# Patient Record
Sex: Female | Born: 1975 | Race: White | Hispanic: No | Marital: Married | Smoking: Former smoker
Health system: Southern US, Community
[De-identification: ages and names within clinical notes are randomized; demographics above are authoritative.]

## PROBLEM LIST (undated history)

## (undated) DIAGNOSIS — F329 Major depressive disorder, single episode, unspecified: Secondary | ICD-10-CM

## (undated) DIAGNOSIS — I10 Essential (primary) hypertension: Secondary | ICD-10-CM

## (undated) DIAGNOSIS — F32A Depression, unspecified: Secondary | ICD-10-CM

## (undated) HISTORY — DX: Essential (primary) hypertension: I10

## (undated) HISTORY — DX: Depression, unspecified: F32.A

## (undated) HISTORY — DX: Major depressive disorder, single episode, unspecified: F32.9

## (undated) HISTORY — PX: OTHER SURGICAL HISTORY: SHX169

---

## 1993-05-14 HISTORY — PX: LIPOSUCTION: SHX10

## 1998-10-05 ENCOUNTER — Other Ambulatory Visit: Admission: RE | Admit: 1998-10-05 | Discharge: 1998-10-05 | Payer: Self-pay | Admitting: Obstetrics and Gynecology

## 1999-11-10 ENCOUNTER — Other Ambulatory Visit: Admission: RE | Admit: 1999-11-10 | Discharge: 1999-11-10 | Payer: Self-pay | Admitting: Obstetrics and Gynecology

## 2000-01-05 ENCOUNTER — Other Ambulatory Visit: Admission: RE | Admit: 2000-01-05 | Discharge: 2000-01-05 | Payer: Self-pay | Admitting: Obstetrics and Gynecology

## 2000-01-05 ENCOUNTER — Encounter (INDEPENDENT_AMBULATORY_CARE_PROVIDER_SITE_OTHER): Payer: Self-pay

## 2000-05-08 ENCOUNTER — Other Ambulatory Visit: Admission: RE | Admit: 2000-05-08 | Discharge: 2000-05-08 | Payer: Self-pay | Admitting: Obstetrics and Gynecology

## 2000-09-23 ENCOUNTER — Other Ambulatory Visit: Admission: RE | Admit: 2000-09-23 | Discharge: 2000-09-23 | Payer: Self-pay | Admitting: Obstetrics and Gynecology

## 2000-12-20 ENCOUNTER — Other Ambulatory Visit: Admission: RE | Admit: 2000-12-20 | Discharge: 2000-12-20 | Payer: Self-pay | Admitting: Obstetrics and Gynecology

## 2001-08-01 ENCOUNTER — Other Ambulatory Visit: Admission: RE | Admit: 2001-08-01 | Discharge: 2001-08-01 | Payer: Self-pay | Admitting: Obstetrics and Gynecology

## 2001-12-23 ENCOUNTER — Other Ambulatory Visit: Admission: RE | Admit: 2001-12-23 | Discharge: 2001-12-23 | Payer: Self-pay | Admitting: Obstetrics and Gynecology

## 2002-12-25 ENCOUNTER — Other Ambulatory Visit: Admission: RE | Admit: 2002-12-25 | Discharge: 2002-12-25 | Payer: Self-pay | Admitting: Gynecology

## 2003-10-17 ENCOUNTER — Emergency Department (HOSPITAL_COMMUNITY): Admission: EM | Admit: 2003-10-17 | Discharge: 2003-10-17 | Payer: Self-pay | Admitting: Emergency Medicine

## 2003-10-18 ENCOUNTER — Encounter: Admission: RE | Admit: 2003-10-18 | Discharge: 2003-10-18 | Payer: Self-pay | Admitting: Family Medicine

## 2003-10-21 ENCOUNTER — Encounter: Admission: RE | Admit: 2003-10-21 | Discharge: 2003-10-21 | Payer: Self-pay | Admitting: Family Medicine

## 2003-12-27 ENCOUNTER — Other Ambulatory Visit: Admission: RE | Admit: 2003-12-27 | Discharge: 2003-12-27 | Payer: Self-pay | Admitting: Gynecology

## 2004-06-08 ENCOUNTER — Ambulatory Visit: Payer: Self-pay | Admitting: Family Medicine

## 2004-11-09 ENCOUNTER — Encounter: Admission: RE | Admit: 2004-11-09 | Discharge: 2004-11-09 | Payer: Self-pay | Admitting: Nephrology

## 2004-12-28 ENCOUNTER — Other Ambulatory Visit: Admission: RE | Admit: 2004-12-28 | Discharge: 2004-12-28 | Payer: Self-pay | Admitting: Obstetrics and Gynecology

## 2005-01-24 ENCOUNTER — Encounter: Admission: RE | Admit: 2005-01-24 | Discharge: 2005-01-24 | Payer: Self-pay | Admitting: Orthopedic Surgery

## 2007-05-22 ENCOUNTER — Encounter: Admission: RE | Admit: 2007-05-22 | Discharge: 2007-05-22 | Payer: Self-pay | Admitting: Internal Medicine

## 2008-05-05 ENCOUNTER — Encounter: Admission: RE | Admit: 2008-05-05 | Discharge: 2008-05-05 | Payer: Self-pay | Admitting: Obstetrics and Gynecology

## 2008-09-16 ENCOUNTER — Encounter: Admission: RE | Admit: 2008-09-16 | Discharge: 2008-09-16 | Payer: Self-pay | Admitting: Orthopedic Surgery

## 2009-01-04 ENCOUNTER — Ambulatory Visit (HOSPITAL_COMMUNITY): Admission: RE | Admit: 2009-01-04 | Discharge: 2009-01-04 | Payer: Self-pay | Admitting: Obstetrics and Gynecology

## 2009-07-10 ENCOUNTER — Inpatient Hospital Stay (HOSPITAL_COMMUNITY): Admission: AD | Admit: 2009-07-10 | Discharge: 2009-07-10 | Payer: Self-pay | Admitting: Obstetrics and Gynecology

## 2009-08-12 ENCOUNTER — Inpatient Hospital Stay (HOSPITAL_COMMUNITY): Admission: AD | Admit: 2009-08-12 | Discharge: 2009-08-21 | Payer: Self-pay | Admitting: Obstetrics & Gynecology

## 2009-08-15 ENCOUNTER — Encounter (INDEPENDENT_AMBULATORY_CARE_PROVIDER_SITE_OTHER): Payer: Self-pay | Admitting: Obstetrics & Gynecology

## 2009-09-08 ENCOUNTER — Encounter: Admission: RE | Admit: 2009-09-08 | Discharge: 2009-09-15 | Payer: Self-pay | Admitting: Obstetrics and Gynecology

## 2009-09-22 ENCOUNTER — Ambulatory Visit
Admission: RE | Admit: 2009-09-22 | Discharge: 2009-09-22 | Payer: Self-pay | Source: Home / Self Care | Admitting: Obstetrics and Gynecology

## 2010-08-02 LAB — COMPREHENSIVE METABOLIC PANEL
ALT: 109 U/L — ABNORMAL HIGH (ref 0–35)
ALT: 133 U/L — ABNORMAL HIGH (ref 0–35)
ALT: 28 U/L (ref 0–35)
ALT: 29 U/L (ref 0–35)
ALT: 35 U/L (ref 0–35)
ALT: 66 U/L — ABNORMAL HIGH (ref 0–35)
ALT: 66 U/L — ABNORMAL HIGH (ref 0–35)
ALT: 84 U/L — ABNORMAL HIGH (ref 0–35)
AST: 125 U/L — ABNORMAL HIGH (ref 0–37)
AST: 34 U/L (ref 0–37)
AST: 44 U/L — ABNORMAL HIGH (ref 0–37)
AST: 50 U/L — ABNORMAL HIGH (ref 0–37)
Albumin: 2.2 g/dL — ABNORMAL LOW (ref 3.5–5.2)
Albumin: 2.7 g/dL — ABNORMAL LOW (ref 3.5–5.2)
Albumin: 2.7 g/dL — ABNORMAL LOW (ref 3.5–5.2)
Albumin: 2.8 g/dL — ABNORMAL LOW (ref 3.5–5.2)
Albumin: 3 g/dL — ABNORMAL LOW (ref 3.5–5.2)
Albumin: 2.8 g/dL — ABNORMAL LOW (ref 3.5–5.2)
Alkaline Phosphatase: 61 U/L (ref 39–117)
Alkaline Phosphatase: 65 U/L (ref 39–117)
Alkaline Phosphatase: 72 U/L (ref 39–117)
Alkaline Phosphatase: 72 U/L (ref 39–117)
Alkaline Phosphatase: 73 U/L (ref 39–117)
Alkaline Phosphatase: 73 U/L (ref 39–117)
BUN: 13 mg/dL (ref 6–23)
BUN: 14 mg/dL (ref 6–23)
BUN: 3 mg/dL — ABNORMAL LOW (ref 6–23)
BUN: 7 mg/dL (ref 6–23)
BUN: 7 mg/dL (ref 6–23)
BUN: 8 mg/dL (ref 6–23)
BUN: 7 mg/dL (ref 6–23)
CO2: 22 mEq/L (ref 19–32)
CO2: 23 mEq/L (ref 19–32)
CO2: 25 mEq/L (ref 19–32)
CO2: 26 mEq/L (ref 19–32)
CO2: 27 mEq/L (ref 19–32)
CO2: 28 mEq/L (ref 19–32)
CO2: 28 mEq/L (ref 19–32)
CO2: 29 mEq/L (ref 19–32)
CO2: 31 mEq/L (ref 19–32)
Calcium: 7.4 mg/dL — ABNORMAL LOW (ref 8.4–10.5)
Calcium: 7.6 mg/dL — ABNORMAL LOW (ref 8.4–10.5)
Calcium: 7.8 mg/dL — ABNORMAL LOW (ref 8.4–10.5)
Calcium: 8 mg/dL — ABNORMAL LOW (ref 8.4–10.5)
Calcium: 8.1 mg/dL — ABNORMAL LOW (ref 8.4–10.5)
Calcium: 8.3 mg/dL — ABNORMAL LOW (ref 8.4–10.5)
Calcium: 8.6 mg/dL (ref 8.4–10.5)
Calcium: 8.8 mg/dL (ref 8.4–10.5)
Chloride: 100 mEq/L (ref 96–112)
Chloride: 102 mEq/L (ref 96–112)
Chloride: 102 mEq/L (ref 96–112)
Chloride: 102 mEq/L (ref 96–112)
Chloride: 103 mEq/L (ref 96–112)
Chloride: 104 mEq/L (ref 96–112)
Creatinine, Ser: 0.67 mg/dL (ref 0.4–1.2)
Creatinine, Ser: 0.67 mg/dL (ref 0.4–1.2)
Creatinine, Ser: 0.72 mg/dL (ref 0.4–1.2)
Creatinine, Ser: 0.73 mg/dL (ref 0.4–1.2)
Creatinine, Ser: 0.78 mg/dL (ref 0.4–1.2)
Creatinine, Ser: 0.83 mg/dL (ref 0.4–1.2)
Creatinine, Ser: 0.55 mg/dL (ref 0.4–1.2)
GFR calc Af Amer: >60 mL/min (ref >60)
GFR calc Af Amer: >60 mL/min (ref >60)
GFR calc Af Amer: >60 mL/min (ref >60)
GFR calc Af Amer: >60 mL/min (ref >60)
GFR calc Af Amer: >60 mL/min (ref >60)
GFR calc Af Amer: >60 mL/min (ref >60)
GFR calc Af Amer: >60 mL/min (ref >60)
GFR calc non Af Amer: >60 mL/min (ref >60)
GFR calc non Af Amer: >60 mL/min (ref >60)
GFR calc non Af Amer: >60 mL/min (ref >60)
GFR calc non Af Amer: >60 mL/min (ref >60)
GFR calc non Af Amer: >60 mL/min (ref >60)
GFR calc non Af Amer: >60 mL/min (ref >60)
GFR calc non Af Amer: >60 mL/min (ref >60)
GFR calc non Af Amer: >60 mL/min (ref >60)
GFR calc non Af Amer: >60 mL/min (ref >60)
GFR calc non Af Amer: >60 mL/min (ref >60)
GFR calc non Af Amer: >60 mL/min (ref >60)
Glucose, Bld: 103 mg/dL — ABNORMAL HIGH (ref 70–99)
Glucose, Bld: 109 mg/dL — ABNORMAL HIGH (ref 70–99)
Glucose, Bld: 158 mg/dL — ABNORMAL HIGH (ref 70–99)
Glucose, Bld: 80 mg/dL (ref 70–99)
Glucose, Bld: 81 mg/dL (ref 70–99)
Glucose, Bld: 87 mg/dL (ref 70–99)
Glucose, Bld: 88 mg/dL (ref 70–99)
Glucose, Bld: 90 mg/dL (ref 70–99)
Glucose, Bld: 99 mg/dL (ref 70–99)
Potassium: 3.8 mEq/L (ref 3.5–5.1)
Potassium: 3.9 mEq/L (ref 3.5–5.1)
Potassium: 4 mEq/L (ref 3.5–5.1)
Potassium: 4.1 mEq/L (ref 3.5–5.1)
Potassium: 4.1 mEq/L (ref 3.5–5.1)
Potassium: 4.5 mEq/L (ref 3.5–5.1)
Sodium: 130 mEq/L — ABNORMAL LOW (ref 135–145)
Sodium: 131 mEq/L — ABNORMAL LOW (ref 135–145)
Sodium: 132 mEq/L — ABNORMAL LOW (ref 135–145)
Sodium: 133 mEq/L — ABNORMAL LOW (ref 135–145)
Sodium: 135 mEq/L (ref 135–145)
Sodium: 135 mEq/L (ref 135–145)
Sodium: 135 mEq/L (ref 135–145)
Sodium: 137 mEq/L (ref 135–145)
Sodium: 137 mEq/L (ref 135–145)
Sodium: 137 mEq/L (ref 135–145)
Sodium: 137 mEq/L (ref 135–145)
Total Bilirubin: 0.3 mg/dL (ref 0.3–1.2)
Total Bilirubin: 0.4 mg/dL (ref 0.3–1.2)
Total Bilirubin: 0.4 mg/dL (ref 0.3–1.2)
Total Bilirubin: 0.5 mg/dL (ref 0.3–1.2)
Total Bilirubin: 0.2 mg/dL — ABNORMAL LOW (ref 0.3–1.2)
Total Protein: 5 g/dL — ABNORMAL LOW (ref 6.0–8.3)
Total Protein: 5.4 g/dL — ABNORMAL LOW (ref 6.0–8.3)
Total Protein: 5.5 g/dL — ABNORMAL LOW (ref 6.0–8.3)
Total Protein: 5.7 g/dL — ABNORMAL LOW (ref 6.0–8.3)
Total Protein: 5.8 g/dL — ABNORMAL LOW (ref 6.0–8.3)
Total Protein: 5.9 g/dL — ABNORMAL LOW (ref 6.0–8.3)

## 2010-08-02 LAB — CBC
HCT: 33.2 % — ABNORMAL LOW (ref 36.0–46.0)
HCT: 33.2 % — ABNORMAL LOW (ref 36.0–46.0)
HCT: 34.2 % — ABNORMAL LOW (ref 36.0–46.0)
HCT: 37.5 % (ref 36.0–46.0)
HCT: 39.1 % (ref 36.0–46.0)
HCT: 36 % (ref 36.0–46.0)
Hemoglobin: 11.3 g/dL — ABNORMAL LOW (ref 12.0–15.0)
Hemoglobin: 11.4 g/dL — ABNORMAL LOW (ref 12.0–15.0)
Hemoglobin: 11.6 g/dL — ABNORMAL LOW (ref 12.0–15.0)
Hemoglobin: 11.6 g/dL — ABNORMAL LOW (ref 12.0–15.0)
Hemoglobin: 12.2 g/dL (ref 12.0–15.0)
Hemoglobin: 12.2 g/dL (ref 12.0–15.0)
Hemoglobin: 12.2 g/dL (ref 12.0–15.0)
Hemoglobin: 12.6 g/dL (ref 12.0–15.0)
Hemoglobin: 12.7 g/dL (ref 12.0–15.0)
MCHC: 33.8 g/dL (ref 30.0–36.0)
MCHC: 34 g/dL (ref 30.0–36.0)
MCHC: 34.1 g/dL (ref 30.0–36.0)
MCHC: 34.2 g/dL (ref 30.0–36.0)
MCHC: 34.4 g/dL (ref 30.0–36.0)
MCHC: 34.5 g/dL (ref 30.0–36.0)
MCHC: 34.6 g/dL (ref 30.0–36.0)
MCHC: 34.7 g/dL (ref 30.0–36.0)
MCV: 92.7 fL (ref 78.0–100.0)
MCV: 93 fL (ref 78.0–100.0)
MCV: 93 fL (ref 78.0–100.0)
MCV: 93.2 fL (ref 78.0–100.0)
MCV: 93.2 fL (ref 78.0–100.0)
MCV: 93.3 fL (ref 78.0–100.0)
MCV: 93.3 fL (ref 78.0–100.0)
MCV: 93.6 fL (ref 78.0–100.0)
MCV: 93.6 fL (ref 78.0–100.0)
MCV: 92.3 fL (ref 78.0–100.0)
Platelets: 110 10*3/uL — ABNORMAL LOW (ref 150–400)
Platelets: 117 10*3/uL — ABNORMAL LOW (ref 150–400)
Platelets: 129 10*3/uL — ABNORMAL LOW (ref 150–400)
Platelets: 202 10*3/uL (ref 150–400)
Platelets: 210 10*3/uL (ref 150–400)
Platelets: 68 10*3/uL — ABNORMAL LOW (ref 150–400)
Platelets: 93 10*3/uL — ABNORMAL LOW (ref 150–400)
Platelets: 166 10*3/uL (ref 150–400)
RBC: 3.36 MIL/uL — ABNORMAL LOW (ref 3.87–5.11)
RBC: 3.47 MIL/uL — ABNORMAL LOW (ref 3.87–5.11)
RBC: 3.54 MIL/uL — ABNORMAL LOW (ref 3.87–5.11)
RBC: 3.58 MIL/uL — ABNORMAL LOW (ref 3.87–5.11)
RBC: 3.67 MIL/uL — ABNORMAL LOW (ref 3.87–5.11)
RBC: 3.82 MIL/uL — ABNORMAL LOW (ref 3.87–5.11)
RBC: 3.84 MIL/uL — ABNORMAL LOW (ref 3.87–5.11)
RBC: 4.03 MIL/uL (ref 3.87–5.11)
RBC: 4.1 MIL/uL (ref 3.87–5.11)
RDW: 12.9 % (ref 11.5–15.5)
RDW: 13.1 % (ref 11.5–15.5)
RDW: 13.3 % (ref 11.5–15.5)
RDW: 13.4 % (ref 11.5–15.5)
RDW: 13.4 % (ref 11.5–15.5)
RDW: 13.5 % (ref 11.5–15.5)
RDW: 12.3 % (ref 11.5–15.5)
WBC: 11.6 10*3/uL — ABNORMAL HIGH (ref 4.0–10.5)
WBC: 12.3 10*3/uL — ABNORMAL HIGH (ref 4.0–10.5)
WBC: 14.2 10*3/uL — ABNORMAL HIGH (ref 4.0–10.5)
WBC: 9.8 10*3/uL (ref 4.0–10.5)
WBC: 9.9 10*3/uL (ref 4.0–10.5)

## 2010-08-02 LAB — PLATELET COUNT: Platelets: 72 10*3/uL — ABNORMAL LOW (ref 150–400)

## 2010-08-02 LAB — URINALYSIS, ROUTINE W REFLEX MICROSCOPIC
Bilirubin Urine: NEGATIVE
Glucose, UA: NEGATIVE mg/dL
Nitrite: NEGATIVE
Protein, ur: NEGATIVE mg/dL
Urobilinogen, UA: 0.2 mg/dL (ref 0.0–1.0)
pH: 7 (ref 5.0–8.0)

## 2010-08-02 LAB — LACTATE DEHYDROGENASE
LDH: 135 U/L (ref 94–250)
LDH: 150 U/L (ref 94–250)
LDH: 163 U/L (ref 94–250)
LDH: 206 U/L (ref 94–250)
LDH: 217 U/L (ref 94–250)
LDH: 238 U/L (ref 94–250)
LDH: 298 U/L — ABNORMAL HIGH (ref 94–250)
LDH: 394 U/L — ABNORMAL HIGH (ref 94–250)

## 2010-08-02 LAB — STREP B DNA PROBE: Strep Group B Ag: NEGATIVE

## 2010-08-02 LAB — CREATININE CLEARANCE, URINE, 24 HOUR
Collection Interval-CRCL: 24 hours
Creatinine Clearance: 127 mL/min — ABNORMAL HIGH (ref 75–115)
Creatinine, Urine: 35.4 mg/dL
Creatinine: 0.73 mg/dL (ref 0.4–1.2)
Urine Total Volume-CRCL: 3775 mL

## 2010-08-02 LAB — AMYLASE: Amylase: 35 U/L (ref 0–105)

## 2010-08-02 LAB — MAGNESIUM
Magnesium: 4.7 mg/dL — ABNORMAL HIGH (ref 1.5–2.5)
Magnesium: 5 mg/dL — ABNORMAL HIGH (ref 1.5–2.5)
Magnesium: 5.2 mg/dL — ABNORMAL HIGH (ref 1.5–2.5)
Magnesium: 5.5 mg/dL — ABNORMAL HIGH (ref 1.5–2.5)
Magnesium: 6.4 mg/dL (ref 1.5–2.5)

## 2010-08-02 LAB — URIC ACID
Uric Acid, Serum: 5.8 mg/dL (ref 2.4–7.0)
Uric Acid, Serum: 6 mg/dL (ref 2.4–7.0)
Uric Acid, Serum: 6.3 mg/dL (ref 2.4–7.0)
Uric Acid, Serum: 6.4 mg/dL (ref 2.4–7.0)

## 2010-08-02 LAB — PROTEIN, URINE, 24 HOUR: Protein, 24H Urine: 227 mg/d — ABNORMAL HIGH (ref 50–100)

## 2011-02-05 ENCOUNTER — Encounter (INDEPENDENT_AMBULATORY_CARE_PROVIDER_SITE_OTHER): Payer: Self-pay

## 2011-02-20 ENCOUNTER — Ambulatory Visit (INDEPENDENT_AMBULATORY_CARE_PROVIDER_SITE_OTHER): Payer: BC Managed Care – PPO | Admitting: General Surgery

## 2011-02-20 ENCOUNTER — Encounter (INDEPENDENT_AMBULATORY_CARE_PROVIDER_SITE_OTHER): Payer: Self-pay | Admitting: General Surgery

## 2011-02-20 VITALS — BP 134/90 | HR 60 | Temp 98.5°F | Resp 12 | Ht 65.5 in | Wt 163.8 lb

## 2011-02-20 DIAGNOSIS — R1031 Right lower quadrant pain: Secondary | ICD-10-CM

## 2011-02-20 DIAGNOSIS — IMO0001 Reserved for inherently not codable concepts without codable children: Secondary | ICD-10-CM

## 2011-02-20 NOTE — Patient Instructions (Signed)
Come back to see me if pain recurs with a  Bulge.

## 2011-02-20 NOTE — Progress Notes (Signed)
HPI The patient comes in today complaining of pain and discomfort in the right inguinal and groin area. She has never noticed a bulge in that area. The pain is coming she has lifted her young son and has no nausea or vomiting.  PE On examination I do not notice a hernia on either side. I examined the patient in the standing and in the supine position and no hernia was noted on either side. The discomfort was noted right at the pubic attachment of the inguinal ligament however there was no hernia. I did not palpate a femoral hernia either  Studiy review No studies review this time.  Assessment Right inguinal pain of unknown etiology possible A. strain of the muscle.  Plan There is no plan for surgery at this time. If further workup is necessary her primary care physician or her gynecologist should be contacted

## 2012-08-14 ENCOUNTER — Other Ambulatory Visit: Payer: Self-pay | Admitting: Dermatology

## 2014-09-08 ENCOUNTER — Other Ambulatory Visit: Payer: Self-pay | Admitting: Dermatology

## 2015-10-31 DIAGNOSIS — M5442 Lumbago with sciatica, left side: Secondary | ICD-10-CM | POA: Diagnosis not present

## 2015-11-02 DIAGNOSIS — D225 Melanocytic nevi of trunk: Secondary | ICD-10-CM | POA: Diagnosis not present

## 2015-11-02 DIAGNOSIS — L821 Other seborrheic keratosis: Secondary | ICD-10-CM | POA: Diagnosis not present

## 2015-11-02 DIAGNOSIS — D2261 Melanocytic nevi of right upper limb, including shoulder: Secondary | ICD-10-CM | POA: Diagnosis not present

## 2015-11-09 DIAGNOSIS — M5126 Other intervertebral disc displacement, lumbar region: Secondary | ICD-10-CM | POA: Diagnosis not present

## 2015-11-14 DIAGNOSIS — M5442 Lumbago with sciatica, left side: Secondary | ICD-10-CM | POA: Diagnosis not present

## 2015-12-14 DIAGNOSIS — R42 Dizziness and giddiness: Secondary | ICD-10-CM | POA: Diagnosis not present

## 2015-12-14 DIAGNOSIS — I10 Essential (primary) hypertension: Secondary | ICD-10-CM | POA: Diagnosis not present

## 2015-12-14 DIAGNOSIS — Z6830 Body mass index (BMI) 30.0-30.9, adult: Secondary | ICD-10-CM | POA: Diagnosis not present

## 2015-12-14 DIAGNOSIS — H8110 Benign paroxysmal vertigo, unspecified ear: Secondary | ICD-10-CM | POA: Diagnosis not present

## 2015-12-20 DIAGNOSIS — F4321 Adjustment disorder with depressed mood: Secondary | ICD-10-CM | POA: Diagnosis not present

## 2015-12-27 DIAGNOSIS — M5416 Radiculopathy, lumbar region: Secondary | ICD-10-CM | POA: Diagnosis not present

## 2016-01-05 DIAGNOSIS — F4321 Adjustment disorder with depressed mood: Secondary | ICD-10-CM | POA: Diagnosis not present

## 2016-01-23 DIAGNOSIS — F4321 Adjustment disorder with depressed mood: Secondary | ICD-10-CM | POA: Diagnosis not present

## 2016-02-08 DIAGNOSIS — M5416 Radiculopathy, lumbar region: Secondary | ICD-10-CM | POA: Diagnosis not present

## 2016-02-28 DIAGNOSIS — M5442 Lumbago with sciatica, left side: Secondary | ICD-10-CM | POA: Diagnosis not present

## 2016-05-08 DIAGNOSIS — Z113 Encounter for screening for infections with a predominantly sexual mode of transmission: Secondary | ICD-10-CM | POA: Diagnosis not present

## 2016-05-08 DIAGNOSIS — R87610 Atypical squamous cells of undetermined significance on cytologic smear of cervix (ASC-US): Secondary | ICD-10-CM | POA: Diagnosis not present

## 2016-05-08 DIAGNOSIS — Z1231 Encounter for screening mammogram for malignant neoplasm of breast: Secondary | ICD-10-CM | POA: Diagnosis not present

## 2016-05-08 DIAGNOSIS — Z1151 Encounter for screening for human papillomavirus (HPV): Secondary | ICD-10-CM | POA: Diagnosis not present

## 2016-05-08 DIAGNOSIS — Z01419 Encounter for gynecological examination (general) (routine) without abnormal findings: Secondary | ICD-10-CM | POA: Diagnosis not present

## 2016-05-08 DIAGNOSIS — Z6827 Body mass index (BMI) 27.0-27.9, adult: Secondary | ICD-10-CM | POA: Diagnosis not present

## 2016-07-24 DIAGNOSIS — J029 Acute pharyngitis, unspecified: Secondary | ICD-10-CM | POA: Diagnosis not present

## 2016-07-24 DIAGNOSIS — R05 Cough: Secondary | ICD-10-CM | POA: Diagnosis not present

## 2016-07-24 DIAGNOSIS — Z6829 Body mass index (BMI) 29.0-29.9, adult: Secondary | ICD-10-CM | POA: Diagnosis not present

## 2017-01-11 DIAGNOSIS — J111 Influenza due to unidentified influenza virus with other respiratory manifestations: Secondary | ICD-10-CM | POA: Diagnosis not present

## 2017-01-21 DIAGNOSIS — D1801 Hemangioma of skin and subcutaneous tissue: Secondary | ICD-10-CM | POA: Diagnosis not present

## 2017-01-21 DIAGNOSIS — D225 Melanocytic nevi of trunk: Secondary | ICD-10-CM | POA: Diagnosis not present

## 2017-01-21 DIAGNOSIS — L812 Freckles: Secondary | ICD-10-CM | POA: Diagnosis not present

## 2017-01-21 DIAGNOSIS — D2261 Melanocytic nevi of right upper limb, including shoulder: Secondary | ICD-10-CM | POA: Diagnosis not present

## 2017-03-05 DIAGNOSIS — I10 Essential (primary) hypertension: Secondary | ICD-10-CM | POA: Diagnosis not present

## 2017-03-05 DIAGNOSIS — Z6828 Body mass index (BMI) 28.0-28.9, adult: Secondary | ICD-10-CM | POA: Diagnosis not present

## 2017-03-05 DIAGNOSIS — M62838 Other muscle spasm: Secondary | ICD-10-CM | POA: Diagnosis not present

## 2017-03-05 DIAGNOSIS — R0789 Other chest pain: Secondary | ICD-10-CM | POA: Diagnosis not present

## 2017-09-30 ENCOUNTER — Other Ambulatory Visit: Payer: Self-pay

## 2017-09-30 ENCOUNTER — Ambulatory Visit (INDEPENDENT_AMBULATORY_CARE_PROVIDER_SITE_OTHER): Payer: BLUE CROSS/BLUE SHIELD

## 2017-09-30 ENCOUNTER — Ambulatory Visit: Payer: BLUE CROSS/BLUE SHIELD | Admitting: Podiatry

## 2017-09-30 ENCOUNTER — Encounter: Payer: Self-pay | Admitting: Podiatry

## 2017-09-30 VITALS — BP 152/92 | HR 58

## 2017-09-30 DIAGNOSIS — M2010 Hallux valgus (acquired), unspecified foot: Secondary | ICD-10-CM

## 2017-09-30 DIAGNOSIS — M2011 Hallux valgus (acquired), right foot: Secondary | ICD-10-CM

## 2017-09-30 DIAGNOSIS — M779 Enthesopathy, unspecified: Secondary | ICD-10-CM

## 2017-09-30 DIAGNOSIS — M2012 Hallux valgus (acquired), left foot: Secondary | ICD-10-CM

## 2017-09-30 MED ORDER — TRIAMCINOLONE ACETONIDE 10 MG/ML IJ SUSP
10.0000 mg | Freq: Once | INTRAMUSCULAR | Status: AC
  Administered 2017-09-30: 10 mg

## 2017-09-30 MED ORDER — DICLOFENAC SODIUM 75 MG PO TBEC: 75.0000 mg | DELAYED_RELEASE_TABLET | Freq: Two times a day (BID) | ORAL | 2 refills | Status: DC

## 2017-09-30 NOTE — Progress Notes (Signed)
Subjective:   Patient ID: Phyllis Harvey, female   DOB: 42 y.o.   MRN: [id number]   HPI Patient presents stating that she is been developing progressive discomfort with the big toe joint of the right foot that is been going on for fairly long time and over the last few months the whole foot has been bothering her more.  Her mild deformity on the left but not to the same degree and states it does get red and irritated and can be irritated at nighttime also.  Patient does not smoke likes to be active   Review of Systems  All other systems reviewed and are negative.       Objective:  Physical Exam  Constitutional: She appears well-developed and well-nourished.  Cardiovascular: Intact distal pulses.  Pulmonary/Chest: Effort normal.  Musculoskeletal: Normal range of motion.  Neurological: She is alert.  Skin: Skin is warm.  Nursing note and vitals reviewed.   Neurovascular status found to be intact muscle strength is adequate range of motion within normal limits with patient noted to have prominence around the first metatarsal head right with redness and mild discomfort with palpation with mild pain in the rest of the foot but the pain seems to be centered mostly around the big toe joint.  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  Patient was placed on diclofenac 75 mg twice daily will be checked back again in several weeks and may ultimately require bunion correction but I want her see response first to conservative treatment  X-ray indicates that there is elevation  of the intermetatarsal angle right over left with what appears to be a sharp protruding area around the first metatarsal head right

## 2017-09-30 NOTE — Patient Instructions (Signed)

## 2017-11-04 ENCOUNTER — Ambulatory Visit: Payer: BLUE CROSS/BLUE SHIELD | Admitting: Podiatry

## 2017-11-07 DIAGNOSIS — Z6828 Body mass index (BMI) 28.0-28.9, adult: Secondary | ICD-10-CM | POA: Diagnosis not present

## 2017-11-07 DIAGNOSIS — R509 Fever, unspecified: Secondary | ICD-10-CM | POA: Diagnosis not present

## 2017-11-07 DIAGNOSIS — D72819 Decreased white blood cell count, unspecified: Secondary | ICD-10-CM | POA: Diagnosis not present

## 2017-11-07 DIAGNOSIS — D696 Thrombocytopenia, unspecified: Secondary | ICD-10-CM | POA: Diagnosis not present

## 2017-11-20 DIAGNOSIS — D696 Thrombocytopenia, unspecified: Secondary | ICD-10-CM | POA: Diagnosis not present

## 2017-12-25 ENCOUNTER — Ambulatory Visit: Payer: BLUE CROSS/BLUE SHIELD | Admitting: Podiatry

## 2017-12-25 ENCOUNTER — Encounter: Payer: Self-pay | Admitting: Podiatry

## 2017-12-25 DIAGNOSIS — M2010 Hallux valgus (acquired), unspecified foot: Secondary | ICD-10-CM | POA: Diagnosis not present

## 2017-12-25 DIAGNOSIS — G5701 Lesion of sciatic nerve, right lower limb: Secondary | ICD-10-CM | POA: Diagnosis not present

## 2017-12-25 DIAGNOSIS — D3610 Benign neoplasm of peripheral nerves and autonomic nervous system, unspecified: Secondary | ICD-10-CM

## 2017-12-25 NOTE — Progress Notes (Signed)
Subjective:   Patient ID: Phyllis Harvey, female   DOB: 42 y.o.   MRN: [id number]   HPI Patient presents stating that she is getting a lot of pain in the base of her toes and shooting radiating pain that is been going on for a while and its been more consistent lately.  States around the big toe joint is still bothersome but not to the same degree   ROS      Objective:  Physical Exam  Neurovascular status intact with pain most intense third interspace right with moderate radiating discomfort with pain also a mild nature around the metatarsal phalangeal joints and structural bunion deformity right with mild irritation around the first metatarsal head     Assessment:  Possibility for neuroma symptomatology right with possibility for capsulitis or other inflammatory condition with structural bunion deformity     Plan:  H&P and all conditions discussed.  Today under sterile prep of the right forefoot I then injected directly into the nerve root with a combination of a steroidal and a neural lysis agent of purified alcohol with Marcaine.  I want to see the results of this and decide what else may be appropriate ultimately this may be a surgical issue depending on response  X-ray indicates there is moderate structural bunion deformity and no indications of stress fracture or underlying arthritis

## 2018-01-08 ENCOUNTER — Ambulatory Visit: Payer: BLUE CROSS/BLUE SHIELD | Admitting: Podiatry

## 2018-01-08 ENCOUNTER — Encounter: Payer: Self-pay | Admitting: Podiatry

## 2018-01-08 DIAGNOSIS — M779 Enthesopathy, unspecified: Secondary | ICD-10-CM

## 2018-01-08 DIAGNOSIS — D3610 Benign neoplasm of peripheral nerves and autonomic nervous system, unspecified: Secondary | ICD-10-CM

## 2018-01-08 MED ORDER — PREDNISONE 10 MG PO TABS: ORAL_TABLET | ORAL | 0 refills | Status: DC

## 2018-01-10 ENCOUNTER — Telehealth: Payer: Self-pay | Admitting: Podiatry

## 2018-01-10 NOTE — Telephone Encounter (Signed)
Pt called in for a surgical  boot. We have not yet received any in office. Per Ernestine Conrad, she is to wear comfortable shoes and limit her physical activity over the weekend. If we have not received any surgical boots by Tuesday 01/14/2018 she is more than welcome to come in and get a surgical shoe.

## 2018-01-22 ENCOUNTER — Ambulatory Visit: Payer: BLUE CROSS/BLUE SHIELD | Admitting: Podiatry

## 2018-01-22 ENCOUNTER — Encounter: Payer: Self-pay | Admitting: Podiatry

## 2018-01-22 DIAGNOSIS — M779 Enthesopathy, unspecified: Secondary | ICD-10-CM | POA: Diagnosis not present

## 2018-01-22 DIAGNOSIS — D3610 Benign neoplasm of peripheral nerves and autonomic nervous system, unspecified: Secondary | ICD-10-CM

## 2018-01-22 NOTE — Progress Notes (Signed)
Subjective:   Patient ID: Phyllis Harvey, female   DOB: 42 y.o.   MRN: [id number]   HPI Patient presents stating that she is still having a lot of pain in her right forefoot and she is not sure as to what the problem is but is worse that she walks and at times she developed shooting pain   ROS      Objective:  Physical Exam  Neurovascular status intact with patient also possibly having some inflammatory changes of the MPJ versus the possibility for neuroma or other nerve compression issue with both showing symptoms and a worsening of discomfort recently     Assessment:  Appears to be an inflammatory flareup but difficult to say as to whether not the genesis of the problem is related more as a irritation of the nerve root versus inflammatory process     Plan:  H&P condition reviewed and explained that at this point we will start her on a 12-day Sterapred DS Dosepak and then consider the possibility for further treatment of neuroma and also possibility for boot usage for this patient

## 2018-01-22 NOTE — Progress Notes (Signed)
Subjective:   Patient ID: Phyllis Harvey, female   DOB: 42 y.o.   MRN: [id number]   HPI Patient states doing well with significant reduction of discomfort   ROS      Objective:  Physical Exam  Neurovascular status intact with patient's right foot doing well with discomfort still noted upon deep palpation but overall improved     Assessment:  Patient seems to responded well to high-dose steroid treatment     Plan:  Reviewed inflammation versus possible neuroma irritation at this point and very satisfied with how the patient is doing and I am can allow this patient to return to normal activity and if symptoms persist or recur we will have to consider complete immobilization.  Reappoint to recheck

## 2018-02-05 DIAGNOSIS — R87610 Atypical squamous cells of undetermined significance on cytologic smear of cervix (ASC-US): Secondary | ICD-10-CM | POA: Diagnosis not present

## 2018-02-06 DIAGNOSIS — Z130 Encounter for screening for diseases of the blood and blood-forming organs and certain disorders involving the immune mechanism: Secondary | ICD-10-CM | POA: Diagnosis not present

## 2018-02-06 DIAGNOSIS — Z01419 Encounter for gynecological examination (general) (routine) without abnormal findings: Secondary | ICD-10-CM | POA: Diagnosis not present

## 2018-02-06 DIAGNOSIS — Z13220 Encounter for screening for lipoid disorders: Secondary | ICD-10-CM | POA: Diagnosis not present

## 2018-02-06 DIAGNOSIS — Z0000 Encounter for general adult medical examination without abnormal findings: Secondary | ICD-10-CM | POA: Diagnosis not present

## 2018-02-06 DIAGNOSIS — Z131 Encounter for screening for diabetes mellitus: Secondary | ICD-10-CM | POA: Diagnosis not present

## 2018-02-06 DIAGNOSIS — Z1329 Encounter for screening for other suspected endocrine disorder: Secondary | ICD-10-CM | POA: Diagnosis not present

## 2018-02-06 DIAGNOSIS — Z1231 Encounter for screening mammogram for malignant neoplasm of breast: Secondary | ICD-10-CM | POA: Diagnosis not present

## 2018-02-06 DIAGNOSIS — R350 Frequency of micturition: Secondary | ICD-10-CM | POA: Diagnosis not present

## 2018-02-06 DIAGNOSIS — Z6829 Body mass index (BMI) 29.0-29.9, adult: Secondary | ICD-10-CM | POA: Diagnosis not present

## 2018-02-06 DIAGNOSIS — R300 Dysuria: Secondary | ICD-10-CM | POA: Diagnosis not present

## 2018-02-19 DIAGNOSIS — N944 Primary dysmenorrhea: Secondary | ICD-10-CM | POA: Diagnosis not present

## 2018-02-21 DIAGNOSIS — R7301 Impaired fasting glucose: Secondary | ICD-10-CM | POA: Diagnosis not present

## 2018-02-21 DIAGNOSIS — Z23 Encounter for immunization: Secondary | ICD-10-CM | POA: Diagnosis not present

## 2018-02-21 DIAGNOSIS — J069 Acute upper respiratory infection, unspecified: Secondary | ICD-10-CM | POA: Diagnosis not present

## 2018-02-21 DIAGNOSIS — E7849 Other hyperlipidemia: Secondary | ICD-10-CM | POA: Diagnosis not present

## 2018-02-21 DIAGNOSIS — E668 Other obesity: Secondary | ICD-10-CM | POA: Diagnosis not present

## 2018-03-20 DIAGNOSIS — J209 Acute bronchitis, unspecified: Secondary | ICD-10-CM | POA: Diagnosis not present

## 2018-03-20 DIAGNOSIS — Z6829 Body mass index (BMI) 29.0-29.9, adult: Secondary | ICD-10-CM | POA: Diagnosis not present

## 2018-06-11 ENCOUNTER — Ambulatory Visit
Admission: RE | Admit: 2018-06-11 | Discharge: 2018-06-11 | Disposition: A | Discharge status: Home / Self Care | Payer: BLUE CROSS/BLUE SHIELD | Source: Ambulatory Visit | Attending: Internal Medicine | Admitting: Internal Medicine

## 2018-06-11 ENCOUNTER — Other Ambulatory Visit: Payer: Self-pay | Admitting: Internal Medicine

## 2018-06-11 DIAGNOSIS — N390 Urinary tract infection, site not specified: Secondary | ICD-10-CM | POA: Diagnosis not present

## 2018-06-11 DIAGNOSIS — R319 Hematuria, unspecified: Secondary | ICD-10-CM | POA: Diagnosis not present

## 2018-06-11 DIAGNOSIS — R1084 Generalized abdominal pain: Secondary | ICD-10-CM | POA: Diagnosis not present

## 2018-06-11 DIAGNOSIS — R82998 Other abnormal findings in urine: Secondary | ICD-10-CM | POA: Diagnosis not present

## 2018-06-11 DIAGNOSIS — R109 Unspecified abdominal pain: Secondary | ICD-10-CM | POA: Diagnosis not present

## 2018-06-11 DIAGNOSIS — K7689 Other specified diseases of liver: Secondary | ICD-10-CM | POA: Diagnosis not present

## 2018-06-11 DIAGNOSIS — Z6828 Body mass index (BMI) 28.0-28.9, adult: Secondary | ICD-10-CM | POA: Diagnosis not present

## 2018-11-12 DIAGNOSIS — Z20828 Contact with and (suspected) exposure to other viral communicable diseases: Secondary | ICD-10-CM | POA: Diagnosis not present

## 2018-11-24 DIAGNOSIS — R7301 Impaired fasting glucose: Secondary | ICD-10-CM | POA: Diagnosis not present

## 2018-11-24 DIAGNOSIS — R82998 Other abnormal findings in urine: Secondary | ICD-10-CM | POA: Diagnosis not present

## 2018-11-24 DIAGNOSIS — Z0000 Encounter for general adult medical examination without abnormal findings: Secondary | ICD-10-CM | POA: Diagnosis not present

## 2018-11-24 DIAGNOSIS — I10 Essential (primary) hypertension: Secondary | ICD-10-CM | POA: Diagnosis not present

## 2018-11-24 DIAGNOSIS — E7849 Other hyperlipidemia: Secondary | ICD-10-CM | POA: Diagnosis not present

## 2018-11-26 DIAGNOSIS — E669 Obesity, unspecified: Secondary | ICD-10-CM | POA: Diagnosis not present

## 2018-11-26 DIAGNOSIS — R7301 Impaired fasting glucose: Secondary | ICD-10-CM | POA: Diagnosis not present

## 2018-11-26 DIAGNOSIS — E785 Hyperlipidemia, unspecified: Secondary | ICD-10-CM | POA: Diagnosis not present

## 2018-11-26 DIAGNOSIS — Z0000 Encounter for general adult medical examination without abnormal findings: Secondary | ICD-10-CM | POA: Diagnosis not present

## 2018-11-26 DIAGNOSIS — I10 Essential (primary) hypertension: Secondary | ICD-10-CM | POA: Diagnosis not present

## 2018-11-26 DIAGNOSIS — Z1331 Encounter for screening for depression: Secondary | ICD-10-CM | POA: Diagnosis not present

## 2019-02-26 DIAGNOSIS — Z23 Encounter for immunization: Secondary | ICD-10-CM | POA: Diagnosis not present

## 2019-03-11 ENCOUNTER — Other Ambulatory Visit: Payer: Self-pay

## 2019-03-11 DIAGNOSIS — Z20822 Contact with and (suspected) exposure to covid-19: Secondary | ICD-10-CM

## 2019-03-12 LAB — NOVEL CORONAVIRUS, NAA: SARS-CoV-2, NAA: NOT DETECTED

## 2019-03-17 DIAGNOSIS — J029 Acute pharyngitis, unspecified: Secondary | ICD-10-CM | POA: Diagnosis not present

## 2019-03-17 DIAGNOSIS — R509 Fever, unspecified: Secondary | ICD-10-CM | POA: Diagnosis not present

## 2019-03-17 DIAGNOSIS — J0100 Acute maxillary sinusitis, unspecified: Secondary | ICD-10-CM | POA: Diagnosis not present

## 2019-03-17 DIAGNOSIS — Z20828 Contact with and (suspected) exposure to other viral communicable diseases: Secondary | ICD-10-CM | POA: Diagnosis not present

## 2019-03-17 DIAGNOSIS — Z20818 Contact with and (suspected) exposure to other bacterial communicable diseases: Secondary | ICD-10-CM | POA: Diagnosis not present

## 2019-05-21 ENCOUNTER — Ambulatory Visit: Payer: 59 | Attending: Internal Medicine

## 2019-05-21 DIAGNOSIS — Z20822 Contact with and (suspected) exposure to covid-19: Secondary | ICD-10-CM | POA: Insufficient documentation

## 2019-05-23 LAB — NOVEL CORONAVIRUS, NAA: SARS-CoV-2, NAA: NOT DETECTED

## 2019-07-19 ENCOUNTER — Ambulatory Visit: Payer: Self-pay | Attending: Internal Medicine

## 2019-07-19 DIAGNOSIS — Z23 Encounter for immunization: Secondary | ICD-10-CM | POA: Insufficient documentation

## 2019-07-19 NOTE — Progress Notes (Signed)
   Covid-19 Vaccination Clinic  Name:  Phyllis Harvey    MRN: [id number] DOB: 07-May-1976  07/19/2019  [name] was observed post Covid-19 immunization for 15 minutes without incident. She was provided with Vaccine Information Sheet and instruction to access the V-Safe system.   [name] was instructed to call 911 with any severe reactions post vaccine: Marland Kitchen Difficulty breathing  . Swelling of face and throat  . A fast heartbeat  . A bad rash all over body  . Dizziness and weakness   Immunizations Administered    Name Date Dose VIS Date Route   Pfizer COVID-19 Vaccine 07/19/2019  4:30 PM 0.3 mL 04/24/2019 Intramuscular   Manufacturer: [employer]   Lot: [id number]   [place]: [id number]

## 2019-08-19 ENCOUNTER — Ambulatory Visit: Payer: Self-pay | Attending: Internal Medicine

## 2019-08-19 DIAGNOSIS — Z23 Encounter for immunization: Secondary | ICD-10-CM

## 2019-08-19 NOTE — Progress Notes (Signed)
   Covid-19 Vaccination Clinic  Name:  Phyllis Harvey    MRN: [id number] DOB: 02/21/1976  08/19/2019  [name] was observed post Covid-19 immunization for 15 minutes without incident. She was provided with Vaccine Information Sheet and instruction to access the V-Safe system.   [name] was instructed to call 911 with any severe reactions post vaccine: Marland Kitchen Difficulty breathing  . Swelling of face and throat  . A fast heartbeat  . A bad rash all over body  . Dizziness and weakness   Immunizations Administered    Name Date Dose VIS Date Route   Pfizer COVID-19 Vaccine 08/19/2019  9:00 AM 0.3 mL 04/24/2019 Intramuscular   Manufacturer: [employer]   Lot: [id number]   [place]: [id number]

## 2021-07-07 ENCOUNTER — Other Ambulatory Visit: Payer: Self-pay

## 2021-07-07 ENCOUNTER — Ambulatory Visit (INDEPENDENT_AMBULATORY_CARE_PROVIDER_SITE_OTHER): Payer: 59 | Admitting: Orthopedic Surgery

## 2021-07-07 ENCOUNTER — Ambulatory Visit (INDEPENDENT_AMBULATORY_CARE_PROVIDER_SITE_OTHER): Payer: 59

## 2021-07-07 VITALS — Ht 65.0 in | Wt 168.0 lb

## 2021-07-07 DIAGNOSIS — M25561 Pain in right knee: Secondary | ICD-10-CM

## 2021-07-07 DIAGNOSIS — S83271A Complex tear of lateral meniscus, current injury, right knee, initial encounter: Secondary | ICD-10-CM

## 2021-07-08 ENCOUNTER — Encounter: Payer: Self-pay | Admitting: Orthopedic Surgery

## 2021-07-08 MED ORDER — BUPIVACAINE HCL 0.25 % IJ SOLN
4.0000 mL | INTRAMUSCULAR | Status: AC | PRN
  Administered 2021-07-07: 4 mL via INTRA_ARTICULAR

## 2021-07-08 MED ORDER — METHYLPREDNISOLONE ACETATE 40 MG/ML IJ SUSP
40.0000 mg | INTRAMUSCULAR | Status: AC | PRN
  Administered 2021-07-07: 40 mg via INTRA_ARTICULAR

## 2021-07-08 MED ORDER — LIDOCAINE HCL 1 % IJ SOLN
5.0000 mL | INTRAMUSCULAR | Status: AC | PRN
  Administered 2021-07-07: 5 mL

## 2021-07-08 NOTE — Progress Notes (Signed)
Office Visit Note   Patient: Phyllis Harvey           Date of Birth: 1976/02/11           MRN: [id number] Visit Date: 07/07/2021 Requested by: Ginger Organ., MD [address] [place],  [place] [address] PCP: Ginger Organ., MD  Subjective: Chief Complaint  Patient presents with   Right Knee - Pain    HPI: Phyllis Harvey is a 46 year old patient with right knee pain which is chronic but has been worse over the last 4 to 6 weeks.  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  She likes to do walking for exercise 2 to 4 miles 3-5 times a week.  Started having some pain approximately 4 to 6 weeks ago but then the knee became swollen 2 weeks ago.  Describes pain and clicking in the posterior lateral aspect of the knee.  Tight for her to bend.  Hard for her to weight-bear.  She does describe 15 years of symptoms where when the knee is bent it locks up and she has to unlock it.  That gives her pain for several hours after that event.  That has been going on for 15 years.  She played soccer in her youth at a high level.  Takes Advil for symptoms which has helped only moderately with the swelling.  No prior surgery on the right knee.  Otherwise healthy.              ROS: All systems reviewed are negative as they relate to the chief complaint within the history of present illness.  Patient denies  fevers or chills.   Assessment & Plan: Visit Diagnoses:  1. Right knee pain, unspecified chronicity     Plan: Impression is right knee pain with definite mechanical symptoms ongoing for years and recent worsening of symptoms over the past 4 to 6 weeks with 2 weeks of swelling.  She does a home exercise program of walking which has not really helped her symptoms.  She has tried activity modification both at home and at work which has not helped.  Aspiration injection performed today.  We did get about 45 cc of fluid out of the knee.  MRI scan  pending to evaluate possible lateral meniscal tear and/or lateral meniscal instability.  Wrisberg variant a consideration in this case.  Follow-Up Instructions: Return for after MRI.   Orders:  Orders Placed This Encounter  Procedures   XR KNEE 3 VIEW RIGHT   MR Knee Right w/o contrast   No orders of the defined types were placed in this encounter.     Procedures: Large Joint Inj: R knee on 07/07/2021 11:45 AM Indications: diagnostic evaluation, joint swelling and pain Details: 18 G 1.5 in needle, superolateral approach  Arthrogram: No  Medications: 5 mL lidocaine 1 %; 40 mg methylPREDNISolone acetate 40 MG/ML; 4 mL bupivacaine 0.25 % Outcome: tolerated well, no immediate complications Procedure, treatment alternatives, risks and benefits explained, specific risks discussed. Consent was given by the patient. Immediately prior to procedure a time out was called to verify the correct patient, procedure, equipment, support staff and site/side marked as required. Patient was prepped and draped in the usual sterile fashion.      Clinical Data: No additional findings.  Objective: Vital Signs: Ht 5\' 5"  (1.651 m)    Wt 168 lb (76.2 kg)    BMI 27.96  kg/m   Physical Exam:   Constitutional: Patient appears well-developed HEENT:  Head: Normocephalic Eyes:EOM are normal Neck: Normal range of motion Cardiovascular: Normal rate Pulmonary/chest: Effort normal Neurologic: Patient is alert Skin: Skin is warm Psychiatric: Patient has normal mood and affect   Ortho Exam: Ortho exam demonstrates full active and passive range of motion of the right knee.  Moderate effusion is present.  Collateral and cruciate ligaments are stable.  No patellofemoral crepitus.  Has some lateral joint line tenderness.  Extensor mechanism intact.  No masses lymphadenopathy or skin changes noted in that right knee region.  No groin pain with internal/external rotation of the leg.  McMurray compression testing  equivocal on the lateral side negative on the medial side.  Specialty Comments:  No specialty comments available.  Imaging: No results found.   PMFS History: Patient Active Problem List   Diagnosis Date Noted   Inguinal pain, lower right quadrant 02/20/2011   Past Medical History:  Diagnosis Date   Depression    Hypertension     Family History  Problem Relation Age of Onset   Prostate cancer Father    Cancer Father        prostate   Hypertension Mother    Cancer Maternal Grandmother    Diabetes Unknown    Heart attack Maternal Grandfather    Hypertension Maternal Uncle     Past Surgical History:  Procedure Laterality Date   CESAREAN SECTION     cryotherapy of cervix     LIPOSUCTION  1995   Social History   Occupational History   Not on file  Tobacco Use   Smoking status: Former   Smokeless tobacco: Never   Tobacco comments:    quit 2009  Substance and Sexual Activity   Alcohol use: Yes   Drug use: No   Sexual activity: Not on file

## 2021-07-11 NOTE — Addendum Note (Signed)
Addended by: Marlyne Beards on: 07/11/2021 02:49 PM   Modules accepted: Orders

## 2021-07-14 ENCOUNTER — Telehealth: Payer: Self-pay | Admitting: Orthopedic Surgery

## 2021-07-14 NOTE — Telephone Encounter (Signed)
Melissa from [facility] Radiology Scheduling called requesting a signed order that was sent be faxed back to [phone]. Melissa phone number is [phone]. ?

## 2021-07-14 NOTE — Telephone Encounter (Signed)
Faxed

## 2021-07-26 ENCOUNTER — Ambulatory Visit (HOSPITAL_COMMUNITY)
Admission: RE | Admit: 2021-07-26 | Discharge: 2021-07-26 | Disposition: A | Discharge status: Home / Self Care | Payer: 59 | Source: Ambulatory Visit | Attending: Orthopedic Surgery | Admitting: Orthopedic Surgery

## 2021-07-26 ENCOUNTER — Other Ambulatory Visit: Payer: Self-pay

## 2021-07-26 DIAGNOSIS — M25561 Pain in right knee: Secondary | ICD-10-CM | POA: Insufficient documentation

## 2021-07-28 ENCOUNTER — Ambulatory Visit: Payer: 59 | Admitting: Orthopedic Surgery

## 2021-07-28 ENCOUNTER — Other Ambulatory Visit: Payer: Self-pay

## 2021-07-28 ENCOUNTER — Encounter: Payer: Self-pay | Admitting: Orthopedic Surgery

## 2021-07-28 DIAGNOSIS — M25561 Pain in right knee: Secondary | ICD-10-CM | POA: Diagnosis not present

## 2021-07-28 NOTE — Progress Notes (Signed)
? ?Office Visit Note ?  ?Patient: Phyllis Harvey           ?Date of Birth: May 26, 1975           ?MRN: [id number] ?Visit Date: 07/28/2021 ?Requested by: Ginger Organ., MD ?[address] ?[place],  [place] [address] ?PCP: Ginger Organ., MD ? ?Subjective: ?Chief Complaint  ?Patient presents with  ? Other  ?   ?Scan review  ? ? ?HPI: Phyllis Harvey is a 46 year old patient with right knee pain and effusion.  Aspiration and injection helped her knee on the right.  She has not been quite as busy at work lately.  Walking is okay.  Stairs are okay.  Taking Advil for symptoms.  Not really doing sports anymore.  She does like to do walking and hiking.  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  No definite meniscal pathology.  She does have a little bit of swelling in the ACL itself but her knee is stable. ?             ?ROS: All systems reviewed are negative as they relate to the chief complaint within the history of present illness.  Patient denies  fevers or chills. ? ? ?Assessment & Plan: ?Visit Diagnoses:  ?1. Right knee pain, unspecified chronicity   ? ? ?Plan: Impression is right knee pain with no definite arthroscopically treatable pathology present.  The aspiration and injection has helped her knee and she does not have any recurrent effusion.  Plan at this time is observation.  If she develops recurrent effusion then she should come in for repeat assessment.  Follow-up with [place] as needed. ? ?Follow-Up Instructions: Return if symptoms worsen or fail to improve.  ? ?Orders:  ?No orders of the defined types were placed in this encounter. ? ?No orders of the defined types were placed in this encounter. ? ? ? ? Procedures: ?No procedures performed ? ? ?Clinical Data: ?No additional findings. ? ?Objective: ?Vital Signs: There were no vitals taken for this visit. ? ?Physical Exam:  ? ?Constitutional: Patient appears  well-developed ?HEENT:  ?Head: Normocephalic ?Eyes:EOM are normal ?Neck: Normal range of motion ?Cardiovascular: Normal rate ?Pulmonary/chest: Effort normal ?Neurologic: Patient is alert ?Skin: Skin is warm ?Psychiatric: Patient has normal mood and affect ? ? ?Ortho Exam: Ortho exam demonstrates full active and passive range of motion of the right knee.  No effusion.  Collateral and cruciate ligaments are stable.  Mild patellofemoral crepitus is present.  No patellar apprehension.  Pedal pulses palpable.  Quad strength excellent. ? ?Specialty Comments:  ?No specialty comments available. ? ?Imaging: ?No results found. ? ? ?PMFS History: ?Patient Active Problem List  ? Diagnosis Date Noted  ? Inguinal pain, lower right quadrant 02/20/2011  ? ?Past Medical History:  ?Diagnosis Date  ? Depression   ? Hypertension   ?  ?Family History  ?Problem Relation Age of Onset  ? Prostate cancer Father   ? Cancer Father   ?     prostate  ? Hypertension Mother   ? Cancer Maternal Grandmother   ? Diabetes Unknown   ? Heart attack Maternal Grandfather   ? Hypertension Maternal Uncle   ?  ?Past Surgical History:  ?Procedure Laterality Date  ? CESAREAN SECTION    ? cryotherapy of cervix    ? LIPOSUCTION  1995  ? ?Social History  ? ?Occupational History  ? Not on file  ?  Tobacco Use  ? Smoking status: Former  ? Smokeless tobacco: Never  ? Tobacco comments:  ?  quit 2009  ?Substance and Sexual Activity  ? Alcohol use: Yes  ? Drug use: No  ? Sexual activity: Not on file  ? ? ? ? ? ?

## 2021-08-02 ENCOUNTER — Ambulatory Visit: Payer: 59 | Admitting: Orthopedic Surgery

## 2022-01-04 DIAGNOSIS — Z1211 Encounter for screening for malignant neoplasm of colon: Secondary | ICD-10-CM | POA: Diagnosis not present

## 2022-01-04 DIAGNOSIS — M542 Cervicalgia: Secondary | ICD-10-CM | POA: Diagnosis not present

## 2022-01-08 ENCOUNTER — Encounter: Payer: Self-pay | Admitting: Orthopedic Surgery

## 2022-01-08 ENCOUNTER — Ambulatory Visit (INDEPENDENT_AMBULATORY_CARE_PROVIDER_SITE_OTHER): Payer: 59

## 2022-01-08 ENCOUNTER — Ambulatory Visit: Payer: 59 | Admitting: Surgical

## 2022-01-08 DIAGNOSIS — M542 Cervicalgia: Secondary | ICD-10-CM

## 2022-01-08 DIAGNOSIS — M25461 Effusion, right knee: Secondary | ICD-10-CM

## 2022-01-08 MED ORDER — LIDOCAINE HCL 1 % IJ SOLN
5.0000 mL | INTRAMUSCULAR | Status: AC | PRN
  Administered 2022-01-08: 5 mL

## 2022-01-08 MED ORDER — METHYLPREDNISOLONE ACETATE 40 MG/ML IJ SUSP
40.0000 mg | INTRAMUSCULAR | Status: AC | PRN
  Administered 2022-01-08: 40 mg via INTRA_ARTICULAR

## 2022-01-08 MED ORDER — BUPIVACAINE HCL 0.25 % IJ SOLN
4.0000 mL | INTRAMUSCULAR | Status: AC | PRN
  Administered 2022-01-08: 4 mL via INTRA_ARTICULAR

## 2022-01-08 NOTE — Progress Notes (Signed)
Office Visit Note   Patient: Phyllis Harvey           Date of Birth: 11-May-1976           MRN: [id number] Visit Date: 01/08/2022 Requested by: Ginger Organ., MD [address] [place],  [place] [address] PCP: Ginger Organ., MD  Subjective: Chief Complaint  Patient presents with   Right Knee - Pain    HPI: Phyllis Harvey is a 46 y.o. female who presents to the office complaining of right knee pain.  Patient returns for reevaluation of right knee pain.  Had previous aspiration and injection in March 2023 following MRI of the right knee demonstrating mild to moderate chondral thinning throughout the right knee.  Prior injection gave her good relief for about 3 months before symptoms returned slowly.  She has been using muscle relaxer and naproxen both for her right knee and left shoulder which is giving her trouble.  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  Localizes most of the pain to the posterior lateral aspect of the right knee.  It is more stiff in the morning and feels like it is a little unstable in the morning but this improves throughout the day.  Pain gets worse with heavy activity and has been feeling pretty good in the last 5 days as she has been off from work.  She enjoys walking her dog when she is not working.  Not bothering her enough for any surgical intervention.  She also notes left shoulder pain and left-sided neck pain that has been bothering her over the last several weeks.  She has seen her PCP who has prescribed muscle relaxer and naproxen as well as given her physical therapy exercises.  She is considering dry needling with the physical therapist.  She has done massage therapy.  No numbness or tingling.  Localizes most of her pain to the left side of the neck and the superior trap region of the left shoulder.  No radicular pain down the arm.  No weakness in the arm.  She is never had  this pain before.  No history of prior shoulder surgery or dislocation or any neck surgery..                ROS: All systems reviewed are negative as they relate to the chief complaint within the history of present illness.  Patient denies fevers or chills.  Assessment & Plan: Visit Diagnoses:  1. Effusion, right knee   2. Neck pain     Plan: Patient is a 45 year old female who presents for evaluation of left-sided neck pain as well as right knee pain.  She has had previous injection in the right knee with good relief.  She has some early degenerative changes in the right knee but overall she is functioning well.  She would like to try repeat injection.  15 cc was aspirated from the right knee and was injection successfully administered and patient tolerated the procedure well.  Plan for her to follow-up with the office as needed given her right knee.    With her ongoing neck pain and superior left shoulder pain, cervical spine radiographs were taken today that demonstrate some lordosis.  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  After discussion of options, patient would like to continue with trying exercises and  anti-inflammatories with some dry needling and massage therapy.  She will call the office in the future if this is not quite cutting it; neck step would be trying to get an MRI scan of the cervical spine with epidural steroid injections to follow after that.  Follow-up in the office as needed.  Follow-Up Instructions: No follow-ups on file.   Orders:  Orders Placed This Encounter  Procedures   XR Cervical Spine 2 or 3 views   No orders of the defined types were placed in this encounter.     Procedures: Large Joint Inj: R knee on 01/08/2022 3:10 PM Indications: diagnostic evaluation, joint swelling and pain Details: 18 G 1.5 in needle, superolateral approach  Arthrogram: No  Medications: 5 mL lidocaine 1 %; 40 mg  methylPREDNISolone acetate 40 MG/ML; 4 mL bupivacaine 0.25 % Aspirate: 15 mL Outcome: tolerated well, no immediate complications Procedure, treatment alternatives, risks and benefits explained, specific risks discussed. Consent was given by the patient. Immediately prior to procedure a time out was called to verify the correct patient, procedure, equipment, support staff and site/side marked as required. Patient was prepped and draped in the usual sterile fashion.      Clinical Data: No additional findings.  Objective: Vital Signs: There were no vitals taken for this visit.  Physical Exam:  Constitutional: Patient appears well-developed HEENT:  Head: Normocephalic Eyes:EOM are normal Neck: Normal range of motion Cardiovascular: Normal rate Pulmonary/chest: Effort normal Neurologic: Patient is alert Skin: Skin is warm Psychiatric: Patient has normal mood and affect  Ortho Exam: Ortho exam demonstrates right knee with 0 degrees extension and 130 degrees knee flexion.  Small effusion noted.  No calf tenderness.  Negative Homans' sign.  Tender to palpation over the posterior lateral joint line moderately and mild tenderness over the medial joint line.  Able to perform straight leg raise without extensor lag.  No pain with hip range of motion.  Stable to anterior posterior drawer sign.  Negative Lachman exam.  Patient also has left shoulder with 45 degrees external rotation, 100 Breze abduction, 175 degrees forward flexion.  No tenderness over the bicipital groove, AC joint.  Excellent rotator cuff strength of supra, infra, subscap rated 5/5.  She has 5/5 motor strength of bilateral grip strength, finger abduction, EPL, wrist extension, pronation/supination, bicep, tricep, deltoid.  Mild to moderate tenderness over the axial cervical spine and left-sided paraspinal musculature.  Mild pain with Lhermitte sign but does not reproduce radicular pain just left-sided neck pain.  Negative Spurling  sign.  Specialty Comments:  No specialty comments available.  Imaging: No results found.   PMFS History: Patient Active Problem List   Diagnosis Date Noted   Inguinal pain, lower right quadrant 02/20/2011   Past Medical History:  Diagnosis Date   Depression    Hypertension     Family History  Problem Relation Age of Onset   Prostate cancer Father    Cancer Father        prostate   Hypertension Mother    Cancer Maternal Grandmother    Diabetes Unknown    Heart attack Maternal Grandfather    Hypertension Maternal Uncle     Past Surgical History:  Procedure Laterality Date   CESAREAN SECTION     cryotherapy of cervix     LIPOSUCTION  1995   Social History   Occupational History   Not on file  Tobacco Use   Smoking status: Former   Smokeless tobacco: Never   Tobacco comments:  quit 2009  Substance and Sexual Activity   Alcohol use: Yes   Drug use: No   Sexual activity: Not on file

## 2022-01-16 DIAGNOSIS — E785 Hyperlipidemia, unspecified: Secondary | ICD-10-CM | POA: Diagnosis not present

## 2022-01-16 DIAGNOSIS — I10 Essential (primary) hypertension: Secondary | ICD-10-CM | POA: Diagnosis not present

## 2022-01-16 DIAGNOSIS — R7989 Other specified abnormal findings of blood chemistry: Secondary | ICD-10-CM | POA: Diagnosis not present

## 2022-01-16 DIAGNOSIS — R7301 Impaired fasting glucose: Secondary | ICD-10-CM | POA: Diagnosis not present

## 2022-01-22 DIAGNOSIS — Z1339 Encounter for screening examination for other mental health and behavioral disorders: Secondary | ICD-10-CM | POA: Diagnosis not present

## 2022-01-22 DIAGNOSIS — R7301 Impaired fasting glucose: Secondary | ICD-10-CM | POA: Diagnosis not present

## 2022-01-22 DIAGNOSIS — Z0000 Encounter for general adult medical examination without abnormal findings: Secondary | ICD-10-CM | POA: Diagnosis not present

## 2022-01-22 DIAGNOSIS — Z1331 Encounter for screening for depression: Secondary | ICD-10-CM | POA: Diagnosis not present

## 2022-01-22 DIAGNOSIS — R69 Illness, unspecified: Secondary | ICD-10-CM | POA: Diagnosis not present

## 2022-01-22 DIAGNOSIS — M5459 Other low back pain: Secondary | ICD-10-CM | POA: Diagnosis not present

## 2022-01-22 DIAGNOSIS — R002 Palpitations: Secondary | ICD-10-CM | POA: Diagnosis not present

## 2022-01-22 DIAGNOSIS — I10 Essential (primary) hypertension: Secondary | ICD-10-CM | POA: Diagnosis not present

## 2022-01-22 DIAGNOSIS — E785 Hyperlipidemia, unspecified: Secondary | ICD-10-CM | POA: Diagnosis not present

## 2022-01-22 DIAGNOSIS — G43909 Migraine, unspecified, not intractable, without status migrainosus: Secondary | ICD-10-CM | POA: Diagnosis not present

## 2022-01-22 DIAGNOSIS — M542 Cervicalgia: Secondary | ICD-10-CM | POA: Diagnosis not present

## 2022-01-22 DIAGNOSIS — H8110 Benign paroxysmal vertigo, unspecified ear: Secondary | ICD-10-CM | POA: Diagnosis not present

## 2022-01-22 DIAGNOSIS — R82998 Other abnormal findings in urine: Secondary | ICD-10-CM | POA: Diagnosis not present

## 2022-01-22 DIAGNOSIS — E669 Obesity, unspecified: Secondary | ICD-10-CM | POA: Diagnosis not present

## 2022-03-05 ENCOUNTER — Ambulatory Visit (AMBULATORY_SURGERY_CENTER): Payer: Self-pay

## 2022-03-05 VITALS — Ht 65.0 in | Wt 160.0 lb

## 2022-03-05 DIAGNOSIS — Z1211 Encounter for screening for malignant neoplasm of colon: Secondary | ICD-10-CM

## 2022-03-05 MED ORDER — NA SULFATE-K SULFATE-MG SULF 17.5-3.13-1.6 GM/177ML PO SOLN: 1.0000 | ORAL | 0 refills | Status: DC

## 2022-03-05 NOTE — Progress Notes (Signed)
No egg or soy allergy known to patient  No issues known to pt with past sedation with any surgeries or procedures Patient denies ever being told they had issues or difficulty with intubation  No FH of Malignant Hyperthermia Pt is not on diet pills Pt is not on  home 02  Pt is not on blood thinners  Pt denies issues with constipation  No A fib or A flutter Have any cardiac testing pending--denied Pt instructed to use [employer] or GoodRx for a price reduction on prep   PV conducted over phone. Suprep instructions reviewed and mailed with sample consent to verified address and sent via MyChart.  Patient's chart reviewed by John Nulty CNRA prior to previsit and patient appropriate for the [facility].  Previsit completed and red dot placed by patient's name on their procedure day (on provider's schedule).    

## 2022-03-12 ENCOUNTER — Encounter: Payer: Self-pay | Admitting: Gastroenterology

## 2022-03-19 ENCOUNTER — Ambulatory Visit (AMBULATORY_SURGERY_CENTER): Payer: 59 | Admitting: Gastroenterology

## 2022-03-19 ENCOUNTER — Encounter: Payer: Self-pay | Admitting: Gastroenterology

## 2022-03-19 VITALS — BP 109/64 | HR 54 | Temp 98.4°F | Resp 19 | Ht 65.0 in | Wt 160.0 lb

## 2022-03-19 DIAGNOSIS — Z1211 Encounter for screening for malignant neoplasm of colon: Secondary | ICD-10-CM | POA: Diagnosis not present

## 2022-03-19 DIAGNOSIS — D124 Benign neoplasm of descending colon: Secondary | ICD-10-CM

## 2022-03-19 DIAGNOSIS — K635 Polyp of colon: Secondary | ICD-10-CM

## 2022-03-19 MED ORDER — SODIUM CHLORIDE 0.9 % IV SOLN: 500.0000 mL | Freq: Once | INTRAVENOUS | Status: DC

## 2022-03-19 NOTE — Op Note (Signed)
[facility] Patient Name: Phyllis Harvey Procedure Date: 03/19/2022 9:36 AM MRN: [id number] Endoscopist: Jackquline Denmark , MD, [id number] Age: 46 Referring MD:  Date of Birth: 1975-10-30 Gender: Female Account #: [id number] Procedure:                Colonoscopy Indications:              Screening for colorectal malignant neoplasm Medicines:                Monitored Anesthesia Care Procedure:                Pre-Anesthesia Assessment:                           - Prior to the procedure, a History and Physical                            was performed, and patient medications and                            allergies were reviewed. The patient's tolerance of                            previous anesthesia was also reviewed. The risks                            and benefits of the procedure and the sedation                            options and risks were discussed with the patient.                            All questions were answered, and informed consent                            was obtained. Prior Anticoagulants: The patient has                            taken no anticoagulant or antiplatelet agents. ASA                            Grade Assessment: II - A patient with mild systemic                            disease. After reviewing the risks and benefits,                            the patient was deemed in satisfactory condition to                            undergo the procedure.                           After obtaining informed consent, the colonoscope  was passed under direct vision. Throughout the                            procedure, the patient's blood pressure, pulse, and                            oxygen saturations were monitored continuously. The                            Olympus PCF-H190DL ([id number]) Colonoscope was                            introduced through the anus and advanced to the 2                            cm into the  ileum. The colonoscopy was performed                            without difficulty. The patient tolerated the                            procedure well. The quality of the bowel                            preparation was good. The terminal ileum, ileocecal                            valve, appendiceal orifice, and rectum were                            photographed. Scope In: 9:38:43 AM Scope Out: 9:58:04 AM Scope Withdrawal Time: 0 hours 11 minutes 57 seconds  Total Procedure Duration: 0 hours 19 minutes 21 seconds  Findings:                 A 6 mm polyp was found in the mid descending colon.                            The polyp was sessile. The polyp was removed with a                            cold snare. Resection and retrieval were complete.                           Non-bleeding internal hemorrhoids were found during                            retroflexion. The hemorrhoids were small and Grade                            I (internal hemorrhoids that do not prolapse).                           The terminal ileum appeared normal.  The exam was otherwise without abnormality on                            direct and retroflexion views. The colon was                            somewhat tortuous. Complications:            No immediate complications. Estimated Blood Loss:     Estimated blood loss: none. Impression:               - One 6 mm polyp in the mid descending colon,                            removed with a cold snare. Resected and retrieved.                           - Non-bleeding internal hemorrhoids.                           - The examined portion of the ileum was normal.                           - The examination was otherwise normal on direct                            and retroflexion views. Recommendation:           - Patient has a contact number available for                            emergencies. The signs and symptoms of potential                             delayed complications were discussed with the                            patient. Return to normal activities tomorrow.                            Written discharge instructions were provided to the                            patient.                           - Resume previous diet.                           - Continue present medications.                           - Await pathology results.                           - Repeat colonoscopy for surveillance based on  pathology results.                           - MiraLAX 17 g p.o. daily.                           - The findings and recommendations were discussed                            with the patient's family. Jackquline Denmark, MD 03/19/2022 10:01:44 AM This report has been signed electronically.

## 2022-03-19 NOTE — Progress Notes (Signed)
Report to PACU, RN, vss, BBS= Clear.  

## 2022-03-19 NOTE — Progress Notes (Signed)
Called to room to assist during endoscopic procedure.  Patient ID and intended procedure confirmed with present staff. Received instructions for my participation in the procedure from the performing physician.  

## 2022-03-19 NOTE — Patient Instructions (Addendum)
Read all of the handouts given to you by your recovery room nurse.   Resume your medications.  YOU HAD AN ENDOSCOPIC PROCEDURE TODAY AT [facility]:   Refer to the procedure report that was given to you for any specific questions about what was found during the examination.  If the procedure report does not answer your questions, please call your gastroenterologist to clarify.  If you requested that your care partner not be given the details of your procedure findings, then the procedure report has been included in a sealed envelope for you to review at your convenience later.  YOU SHOULD EXPECT: Some feelings of bloating in the abdomen. Passage of more gas than usual.  Walking can help get rid of the air that was put into your GI tract during the procedure and reduce the bloating. If you had a lower endoscopy (such as a colonoscopy or flexible sigmoidoscopy) you may notice spotting of blood in your stool or on the toilet paper. If you underwent a bowel prep for your procedure, you may not have a normal bowel movement for a few days.  Please Note:  You might notice some irritation and congestion in your nose or some drainage.  This is from the oxygen used during your procedure.  There is no need for concern and it should clear up in a day or so.  SYMPTOMS TO REPORT IMMEDIATELY:  Following lower endoscopy (colonoscopy or flexible sigmoidoscopy):  Excessive amounts of blood in the stool  Significant tenderness or worsening of abdominal pains  Swelling of the abdomen that is new, acute  Fever of 100F or higher   For urgent or emergent issues, a gastroenterologist can be reached at any hour by calling [phone]. Do not use MyChart messaging for urgent concerns.    DIET:  We do recommend a small meal at first, but then you may proceed to your regular diet.  Drink plenty of fluids but you should avoid alcoholic beverages for 24 hours. Dr Lyndel Safe suggests miralax once per day to  relieve constipation.  Eat a high fiber diet, and drink plenty of water.  ACTIVITY:  You should plan to take it easy for the rest of today and you should NOT DRIVE or use heavy machinery until tomorrow (because of the sedation medicines used during the test).    FOLLOW UP: Our staff will call the number listed on your records the next business day following your procedure.  We will call around 7:15- 8:00 am to check on you and address any questions or concerns that you may have regarding the information given to you following your procedure. If we do not reach you, we will leave a message.     If any biopsies were taken you will be contacted by phone or by letter within the next 1-3 weeks.  Please call us at [phone] if you have not heard about the biopsies in 3 weeks.    SIGNATURES/CONFIDENTIALITY: You and/or your care partner have signed paperwork which will be entered into your electronic medical record.  These signatures attest to the fact that that the information above on your After Visit Summary has been reviewed and is understood.  Full responsibility of the confidentiality of this discharge information lies with you and/or your care-partner.

## 2022-03-19 NOTE — Progress Notes (Signed)
Vs by DT  Pt's states no medical or surgical changes since previsit or office visit.  

## 2022-03-19 NOTE — Progress Notes (Signed)
[facility] History and Physical   Primary Care Physician:  Ginger Organ., MD   Reason for Procedure:   CRC screening  Plan:    colon     HPI: Phyllis Harvey is a 46 y.o. female  No nausea, vomiting, heartburn, regurgitation, odynophagia or dysphagia.  No significant diarrhea .  No melena or hematochezia. No unintentional weight loss. No abdominal pain.  Chr constipation for which she has been taking senna teas 4/week   Past Medical History:  Diagnosis Date   Depression    Hypertension     Past Surgical History:  Procedure Laterality Date   CESAREAN SECTION     cryotherapy of cervix     LIPOSUCTION  1995    Prior to Admission medications   Medication Sig Start Date End Date Taking? Authorizing Provider  ALPRAZolam Duanne Moron) 0.5 MG tablet Take 0.5 mg by mouth every 8 (eight) hours as needed. 01/22/22  Yes [provider]  ibuprofen (ADVIL,MOTRIN) 800 MG tablet Take 800 mg by mouth every 8 (eight) hours as needed.     Yes [provider]  olmesartan-hydrochlorothiazide (BENICAR HCT) 20-12.5 MG tablet Take 1 tablet by mouth daily. 11/29/21  Yes [provider]  cyclobenzaprine (FLEXERIL) 10 MG tablet Take 10 mg by mouth every 8 (eight) hours as needed. 01/04/22   [provider]  naproxen (NAPROSYN) 500 MG tablet TK 1 T PO Q 12 H WF PRN P 06/25/17   [provider]    Current Outpatient Medications  Medication Sig Dispense Refill   ALPRAZolam (XANAX) 0.5 MG tablet Take 0.5 mg by mouth every 8 (eight) hours as needed.     ibuprofen (ADVIL,MOTRIN) 800 MG tablet Take 800 mg by mouth every 8 (eight) hours as needed.       olmesartan-hydrochlorothiazide (BENICAR HCT) 20-12.5 MG tablet Take 1 tablet by mouth daily.     cyclobenzaprine (FLEXERIL) 10 MG tablet Take 10 mg by mouth every 8 (eight) hours as needed.     naproxen (NAPROSYN) 500 MG tablet TK 1 T PO Q 12 H WF PRN P  0   Current Facility-Administered  Medications  Medication Dose Route Frequency Provider Last Rate Last Admin   0.9 %  sodium chloride infusion  500 mL Intravenous Once Jackquline Denmark, MD        Allergies as of 03/19/2022   (No Known Allergies)    Family History  Problem Relation Age of Onset   Hypertension Mother    Prostate cancer Father    Cancer Father        prostate   Hypertension Maternal Uncle    Cancer Maternal Grandmother    Heart attack Maternal Grandfather    Diabetes Other    Colon cancer Neg Hx    Colon polyps Neg Hx    Esophageal cancer Neg Hx    Stomach cancer Neg Hx    Rectal cancer Neg Hx     Social History   Socioeconomic History   Marital status: Married    Spouse name: Not on file   Number of children: Not on file   Years of education: Not on file   Highest education level: Not on file  Occupational History   Not on file  Tobacco Use   Smoking status: Former   Smokeless tobacco: Never   Tobacco comments:    quit 2009  Vaping Use   Vaping Use: Never used  Substance and Sexual Activity   Alcohol use: Yes  Drug use: No   Sexual activity: Not on file  Other Topics Concern   Not on file  Social History Narrative   Not on file   Social Determinants of Health   Financial Resource Strain: Not on file  Food Insecurity: Not on file  Transportation Needs: Not on file  Physical Activity: Not on file  Stress: Not on file  Social Connections: Not on file  Intimate Partner Violence: Not on file    Review of Systems: Positive for none All other review of systems negative except as mentioned in the HPI.  Physical Exam: Vital signs in last 24 hours: '@VSRANGES'$ @   General:   Alert,  Well-developed, well-nourished, pleasant and cooperative in NAD Lungs:  Clear throughout to auscultation.   Heart:  Regular rate and rhythm; no murmurs, clicks, rubs,  or gallops. Abdomen:  Soft, nontender and nondistended. Normal bowel sounds.   Neuro/Psych:  Alert and cooperative. Normal mood  and affect. A and O x 3    No significant changes were identified.  The patient continues to be an appropriate candidate for the planned procedure and anesthesia.   Carmell Austria, MD. [facility] 03/19/2022 9:32 AM@

## 2022-03-20 ENCOUNTER — Telehealth: Payer: Self-pay | Admitting: *Deleted

## 2022-03-20 NOTE — Telephone Encounter (Signed)
  Follow up Call-     03/19/2022    9:07 AM  Call back number  Post procedure Call Back phone  # [phone]  Permission to leave phone message Yes     Patient questions:  Do you have a fever, pain , or abdominal swelling? No. Pain Score  0 *  Have you tolerated food without any problems? Yes.    Have you been able to return to your normal activities? Yes.    Do you have any questions about your discharge instructions: Diet   No. Medications  No. Follow up visit  No.  Do you have questions or concerns about your Care? No.  Actions: * If pain score is 4 or above: No action needed, pain <4.

## 2022-03-23 DIAGNOSIS — Z6828 Body mass index (BMI) 28.0-28.9, adult: Secondary | ICD-10-CM | POA: Diagnosis not present

## 2022-03-23 DIAGNOSIS — Z1231 Encounter for screening mammogram for malignant neoplasm of breast: Secondary | ICD-10-CM | POA: Diagnosis not present

## 2022-03-23 DIAGNOSIS — Z01419 Encounter for gynecological examination (general) (routine) without abnormal findings: Secondary | ICD-10-CM | POA: Diagnosis not present

## 2022-04-01 ENCOUNTER — Encounter: Payer: Self-pay | Admitting: Gastroenterology

## 2022-04-30 ENCOUNTER — Encounter: Payer: Self-pay | Admitting: Orthopedic Surgery

## 2022-04-30 ENCOUNTER — Ambulatory Visit: Payer: 59 | Admitting: Orthopedic Surgery

## 2022-04-30 VITALS — Ht 65.0 in | Wt 165.0 lb

## 2022-04-30 DIAGNOSIS — M25461 Effusion, right knee: Secondary | ICD-10-CM | POA: Diagnosis not present

## 2022-05-05 ENCOUNTER — Encounter: Payer: Self-pay | Admitting: Orthopedic Surgery

## 2022-05-05 MED ORDER — BUPIVACAINE HCL 0.25 % IJ SOLN
4.0000 mL | INTRAMUSCULAR | Status: AC | PRN
  Administered 2022-04-30: 4 mL via INTRA_ARTICULAR

## 2022-05-05 MED ORDER — LIDOCAINE HCL 1 % IJ SOLN
5.0000 mL | INTRAMUSCULAR | Status: AC | PRN
  Administered 2022-04-30: 5 mL

## 2022-05-05 NOTE — Progress Notes (Signed)
Office Visit Note   Patient: Phyllis Harvey           Date of Birth: June 25, 1975           MRN: [id number] Visit Date: 04/30/2022 Requested by: Ginger Organ., MD [address] [place],  [place] [address] PCP: Ginger Organ., MD  Subjective: Chief Complaint  Patient presents with   Right Knee - Pain    HPI: Phyllis Harvey is a 46 y.o. female who presents to the office reporting right knee pain.  Right knee continues to have pain stiffness and swelling.  In general the pain has come slightly more constant.  Hard for her to bend and straighten the right knee.  She is on naproxen.  She had a second aspiration and injection 823 which helped some.  MRI scan from 323 shows wear-and-tear but no definite operative pathology..                ROS: All systems reviewed are negative as they relate to the chief complaint within the history of present illness.  Patient denies fevers or chills.  Assessment & Plan: Visit Diagnoses:  1. Effusion, right knee     Plan: Impression is recurrent effusion and pain in the right knee.  She may be heading for knee arthroscopy which would be primarily more diagnostic but we would anticipate treating any pathology present.  I think it is possible that she has some chondral surface pathology which the MRI scan is not picking up which would account for her recurrent effusions.  Aspiration injection performed again today with Toradol injected.  She will follow-up at the beginning of the year if she wants to consider arthroscopic evaluation.  Follow-Up Instructions: No follow-ups on file.   Orders:  No orders of the defined types were placed in this encounter.  No orders of the defined types were placed in this encounter.     Procedures: Large Joint Inj: R knee on 04/30/2022 6:16 PM Indications: diagnostic evaluation, joint swelling and pain Details: 18 G 1.5 in needle, superolateral approach  Arthrogram: No  Medications: 5 mL lidocaine 1 %;  4 mL bupivacaine 0.25 % Outcome: tolerated well, no immediate complications Procedure, treatment alternatives, risks and benefits explained, specific risks discussed. Consent was given by the patient. Immediately prior to procedure a time out was called to verify the correct patient, procedure, equipment, support staff and site/side marked as required. Patient was prepped and draped in the usual sterile fashion.     Toradol injected  Clinical Data: No additional findings.  Objective: Vital Signs: Ht '5\' 5"'$  (1.651 m)   Wt 165 lb (74.8 kg)   BMI 27.46 kg/m   Physical Exam:  Constitutional: Patient appears well-developed HEENT:  Head: Normocephalic Eyes:EOM are normal Neck: Normal range of motion Cardiovascular: Normal rate Pulmonary/chest: Effort normal Neurologic: Patient is alert Skin: Skin is warm Psychiatric: Patient has normal mood and affect  Ortho Exam: Ortho exam demonstrates normal gait and alignment with good quad tone bilaterally.  Range of motion both knees is full but she does have a mild effusion in the right knee compared to no effusion left knee.  Collateral crucial ligaments are stable.  No patellar apprehension.  Not too much crepitus.  No focal joint line tenderness today.  Specialty Comments:  No specialty comments available.  Imaging: No results found.   PMFS History: Patient Active Problem List   Diagnosis Date Noted   Inguinal pain, lower right quadrant  02/20/2011   Past Medical History:  Diagnosis Date   Depression    Hypertension     Family History  Problem Relation Age of Onset   Hypertension Mother    Prostate cancer Father    Cancer Father        prostate   Hypertension Maternal Uncle    Cancer Maternal Grandmother    Heart attack Maternal Grandfather    Diabetes Other    Colon cancer Neg Hx    Colon polyps Neg Hx    Esophageal cancer Neg Hx    Stomach cancer Neg Hx    Rectal cancer Neg Hx     Past Surgical History:  Procedure  Laterality Date   CESAREAN SECTION     cryotherapy of cervix     LIPOSUCTION  1995   Social History   Occupational History   Not on file  Tobacco Use   Smoking status: Former   Smokeless tobacco: Never   Tobacco comments:    quit 2009  Vaping Use   Vaping Use: Never used  Substance and Sexual Activity   Alcohol use: Yes   Drug use: No   Sexual activity: Not on file

## 2022-05-15 ENCOUNTER — Telehealth: Payer: Self-pay | Admitting: Orthopedic Surgery

## 2022-05-15 NOTE — Telephone Encounter (Signed)
Patient stated that Dr Marlou Sa told her to call back if she wanted surgery [phone]

## 2022-05-16 ENCOUNTER — Encounter: Payer: Self-pay | Admitting: Orthopedic Surgery

## 2022-05-16 NOTE — Telephone Encounter (Signed)
I talked to Phyllis Harvey.  She would like to proceed with arthroscopy.  I think there is a chance that the MRI scan may be missing something or the arthritis in her knee may be more severe than what it appears.  Went through all clinical scenarios of finding something treatable versus not really finding much to explain her swelling.  She is having a lot of pain and swelling going up and down stairs.  Hopefully there is some type of chondral defect we can treat that explains her symptoms.  She understands the risk and benefits of surgery as well as the rehab time.  All questions answered

## 2022-05-17 ENCOUNTER — Other Ambulatory Visit: Payer: Self-pay

## 2022-06-01 ENCOUNTER — Other Ambulatory Visit: Payer: Self-pay

## 2022-06-01 ENCOUNTER — Encounter (HOSPITAL_BASED_OUTPATIENT_CLINIC_OR_DEPARTMENT_OTHER): Payer: Self-pay | Admitting: Orthopedic Surgery

## 2022-06-04 ENCOUNTER — Encounter (HOSPITAL_BASED_OUTPATIENT_CLINIC_OR_DEPARTMENT_OTHER)
Admission: RE | Admit: 2022-06-04 | Discharge: 2022-06-04 | Disposition: A | Discharge status: Home / Self Care | Payer: 59 | Source: Ambulatory Visit | Attending: Orthopedic Surgery | Admitting: Orthopedic Surgery

## 2022-06-04 DIAGNOSIS — I10 Essential (primary) hypertension: Secondary | ICD-10-CM | POA: Insufficient documentation

## 2022-06-04 DIAGNOSIS — Z01818 Encounter for other preprocedural examination: Secondary | ICD-10-CM | POA: Insufficient documentation

## 2022-06-04 LAB — BASIC METABOLIC PANEL
Anion gap: 7 (ref 5–15)
BUN: 17 mg/dL (ref 6–20)
CO2: 25 mmol/L (ref 22–32)
Calcium: 8.9 mg/dL (ref 8.9–10.3)
Chloride: 104 mmol/L (ref 98–111)
Creatinine, Ser: 0.9 mg/dL (ref 0.44–1.00)
GFR, Estimated: >60 mL/min (ref >60)
Glucose, Bld: 94 mg/dL (ref 70–99)
Potassium: 4 mmol/L (ref 3.5–5.1)
Sodium: 136 mmol/L (ref 135–145)

## 2022-06-04 LAB — CBC
HCT: 40.1 % (ref 36.0–46.0)
Hemoglobin: 13.6 g/dL (ref 12.0–15.0)
MCH: 31.8 pg (ref 26.0–34.0)
MCHC: 33.9 g/dL (ref 30.0–36.0)
MCV: 93.7 fL (ref 80.0–100.0)
Platelets: 234 10*3/uL (ref 150–400)
RBC: 4.28 MIL/uL (ref 3.87–5.11)
RDW: 12.6 % (ref 11.5–15.5)
WBC: 6.5 10*3/uL (ref 4.0–10.5)
nRBC: 0 % (ref 0.0–0.2)

## 2022-06-04 NOTE — Progress Notes (Signed)

## 2022-06-07 ENCOUNTER — Telehealth: Payer: Self-pay | Admitting: Orthopedic Surgery

## 2022-06-07 NOTE — Telephone Encounter (Signed)
Looks okay to me and Dr. Marlou Sa reviewed it earlier this morning.  Do not anticipate any cancellation but we will keep her posted if anything changes

## 2022-06-07 NOTE — Telephone Encounter (Signed)
Patient is scheduled for right knee arthroscopy with debridement 06-11-22 at [facility] at 7:30am.  She states a EKG was ordered and she wants to make sure everything is ok still with her having surgery now that the results are in.  Please call patient to advise  [phone].

## 2022-06-07 NOTE — Telephone Encounter (Signed)
I called patient and advised. 

## 2022-06-10 NOTE — Anesthesia Preprocedure Evaluation (Signed)
Anesthesia Evaluation  Patient identified by MRN, date of birth, ID band Patient awake    Reviewed: Allergy & Precautions, H&P , NPO status , Patient's Chart, lab work & pertinent test results  Airway Mallampati: II  TM Distance: >3 FB Neck ROM: Full    Dental no notable dental hx. (+) Teeth Intact, Dental Advisory Given   Pulmonary neg pulmonary ROS, former smoker   Pulmonary exam normal breath sounds clear to auscultation       Cardiovascular Exercise Tolerance: Good hypertension, Pt. on medications negative cardio ROS Normal cardiovascular exam Rhythm:Regular Rate:Normal     Neuro/Psych  PSYCHIATRIC DISORDERS  Depression    negative neurological ROS  negative psych ROS   GI/Hepatic negative GI ROS, Neg liver ROS,,,  Endo/Other  negative endocrine ROS    Renal/GU negative Renal ROS  negative genitourinary   Musculoskeletal negative musculoskeletal ROS (+)    Abdominal   Peds negative pediatric ROS (+)  Hematology negative hematology ROS (+)   Anesthesia Other Findings   Reproductive/Obstetrics negative OB ROS                             Anesthesia Physical Anesthesia Plan  ASA: 2  Anesthesia Plan: General   Post-op Pain Management: Tylenol PO (pre-op)* and Celebrex PO (pre-op)*   Induction: Intravenous  PONV Risk Score and Plan: 3 and Ondansetron, Dexamethasone and Treatment may vary due to age or medical condition  Airway Management Planned: Oral ETT and LMA  Additional Equipment: None  Intra-op Plan:   Post-operative Plan: Extubation in OR  Informed Consent: I have reviewed the patients History and Physical, chart, labs and discussed the procedure including the risks, benefits and alternatives for the proposed anesthesia with the patient or authorized representative who has indicated his/her understanding and acceptance.     Dental advisory given  Plan Discussed  with: Anesthesiologist  Anesthesia Plan Comments:        Anesthesia Quick Evaluation

## 2022-06-11 ENCOUNTER — Ambulatory Visit (HOSPITAL_BASED_OUTPATIENT_CLINIC_OR_DEPARTMENT_OTHER)
Admission: RE | Admit: 2022-06-11 | Discharge: 2022-06-11 | Disposition: A | Discharge status: Home / Self Care | Payer: 59 | Source: Ambulatory Visit | Attending: Orthopedic Surgery | Admitting: Orthopedic Surgery

## 2022-06-11 ENCOUNTER — Ambulatory Visit (HOSPITAL_BASED_OUTPATIENT_CLINIC_OR_DEPARTMENT_OTHER): Payer: 59 | Admitting: Anesthesiology

## 2022-06-11 ENCOUNTER — Encounter (HOSPITAL_BASED_OUTPATIENT_CLINIC_OR_DEPARTMENT_OTHER): Payer: Self-pay | Admitting: Orthopedic Surgery

## 2022-06-11 ENCOUNTER — Other Ambulatory Visit: Payer: Self-pay

## 2022-06-11 ENCOUNTER — Encounter (HOSPITAL_BASED_OUTPATIENT_CLINIC_OR_DEPARTMENT_OTHER)
Admission: RE | Disposition: A | Discharge status: Home / Self Care | Payer: Self-pay | Source: Ambulatory Visit | Attending: Orthopedic Surgery

## 2022-06-11 DIAGNOSIS — Z01818 Encounter for other preprocedural examination: Secondary | ICD-10-CM

## 2022-06-11 DIAGNOSIS — M25461 Effusion, right knee: Secondary | ICD-10-CM | POA: Insufficient documentation

## 2022-06-11 DIAGNOSIS — Z79899 Other long term (current) drug therapy: Secondary | ICD-10-CM | POA: Insufficient documentation

## 2022-06-11 DIAGNOSIS — Z793 Long term (current) use of hormonal contraceptives: Secondary | ICD-10-CM | POA: Diagnosis not present

## 2022-06-11 DIAGNOSIS — M94261 Chondromalacia, right knee: Secondary | ICD-10-CM | POA: Insufficient documentation

## 2022-06-11 DIAGNOSIS — F32A Depression, unspecified: Secondary | ICD-10-CM | POA: Diagnosis not present

## 2022-06-11 DIAGNOSIS — I10 Essential (primary) hypertension: Secondary | ICD-10-CM | POA: Diagnosis not present

## 2022-06-11 DIAGNOSIS — Z87891 Personal history of nicotine dependence: Secondary | ICD-10-CM | POA: Insufficient documentation

## 2022-06-11 DIAGNOSIS — M2241 Chondromalacia patellae, right knee: Secondary | ICD-10-CM

## 2022-06-11 DIAGNOSIS — R69 Illness, unspecified: Secondary | ICD-10-CM | POA: Diagnosis not present

## 2022-06-11 HISTORY — PX: KNEE ARTHROSCOPY: SHX127

## 2022-06-11 LAB — POCT PREGNANCY, URINE: Preg Test, Ur: NEGATIVE

## 2022-06-11 SURGERY — ARTHROSCOPY, KNEE
Anesthesia: General | Site: Knee | Laterality: Right

## 2022-06-11 MED ORDER — CELECOXIB 200 MG PO CAPS
200.0000 mg | ORAL_CAPSULE | Freq: Once | ORAL | Status: AC
  Administered 2022-06-11: 200 mg via ORAL

## 2022-06-11 MED ORDER — MORPHINE SULFATE (PF) 4 MG/ML IV SOLN
INTRAVENOUS | Status: DC | PRN
  Administered 2022-06-11: 8 mg via SUBCUTANEOUS

## 2022-06-11 MED ORDER — SODIUM CHLORIDE 0.9 % IR SOLN: Status: DC | PRN

## 2022-06-11 MED ORDER — FENTANYL CITRATE (PF) 100 MCG/2ML IJ SOLN
INTRAMUSCULAR | Status: AC
  Filled 2022-06-11: qty 2

## 2022-06-11 MED ORDER — BUPIVACAINE-EPINEPHRINE (PF) 0.5% -1:200000 IJ SOLN
INTRAMUSCULAR | Status: DC | PRN
  Administered 2022-06-11: 20 mL via PERINEURAL

## 2022-06-11 MED ORDER — PROPOFOL 10 MG/ML IV BOLUS
INTRAVENOUS | Status: DC | PRN
  Administered 2022-06-11: 200 mg via INTRAVENOUS

## 2022-06-11 MED ORDER — ACETAMINOPHEN 500 MG PO TABS
1000.0000 mg | ORAL_TABLET | Freq: Once | ORAL | Status: AC
  Administered 2022-06-11: 1000 mg via ORAL

## 2022-06-11 MED ORDER — LIDOCAINE 2% (20 MG/ML) 5 ML SYRINGE
INTRAMUSCULAR | Status: AC
  Filled 2022-06-11: qty 5

## 2022-06-11 MED ORDER — ONDANSETRON HCL 4 MG/2ML IJ SOLN
INTRAMUSCULAR | Status: DC | PRN
  Administered 2022-06-11: 4 mg via INTRAVENOUS

## 2022-06-11 MED ORDER — ONDANSETRON HCL 4 MG/2ML IJ SOLN: 4.0000 mg | Freq: Once | INTRAMUSCULAR | Status: DC | PRN

## 2022-06-11 MED ORDER — CELECOXIB 200 MG PO CAPS
ORAL_CAPSULE | ORAL | Status: AC
  Filled 2022-06-11: qty 1

## 2022-06-11 MED ORDER — ACETAMINOPHEN 160 MG/5ML PO SOLN: 325.0000 mg | ORAL | Status: DC | PRN

## 2022-06-11 MED ORDER — MIDAZOLAM HCL 2 MG/2ML IJ SOLN
INTRAMUSCULAR | Status: AC
  Filled 2022-06-11: qty 2

## 2022-06-11 MED ORDER — OXYCODONE HCL 5 MG/5ML PO SOLN: 5.0000 mg | Freq: Once | ORAL | Status: AC | PRN

## 2022-06-11 MED ORDER — ACETAMINOPHEN 325 MG PO TABS: 325.0000 mg | ORAL_TABLET | ORAL | Status: DC | PRN

## 2022-06-11 MED ORDER — MIDAZOLAM HCL 5 MG/5ML IJ SOLN
INTRAMUSCULAR | Status: DC | PRN
  Administered 2022-06-11: 2 mg via INTRAVENOUS

## 2022-06-11 MED ORDER — POVIDONE-IODINE 10 % EX SWAB: 2.0000 | Freq: Once | CUTANEOUS | Status: DC

## 2022-06-11 MED ORDER — ONDANSETRON HCL 4 MG/2ML IJ SOLN
INTRAMUSCULAR | Status: AC
  Filled 2022-06-11: qty 4

## 2022-06-11 MED ORDER — POVIDONE-IODINE 7.5 % EX SOLN: Freq: Once | CUTANEOUS | Status: DC

## 2022-06-11 MED ORDER — CELECOXIB 100 MG PO CAPS: 100.0000 mg | ORAL_CAPSULE | Freq: Two times a day (BID) | ORAL | 0 refills | Status: DC

## 2022-06-11 MED ORDER — LACTATED RINGERS IV SOLN: INTRAVENOUS | Status: DC

## 2022-06-11 MED ORDER — DEXAMETHASONE SODIUM PHOSPHATE 10 MG/ML IJ SOLN
INTRAMUSCULAR | Status: AC
  Filled 2022-06-11: qty 1

## 2022-06-11 MED ORDER — EPINEPHRINE PF 1 MG/ML IJ SOLN
INTRAMUSCULAR | Status: AC
  Filled 2022-06-11: qty 4

## 2022-06-11 MED ORDER — FENTANYL CITRATE (PF) 100 MCG/2ML IJ SOLN
INTRAMUSCULAR | Status: DC | PRN
  Administered 2022-06-11 (×3): 50 ug via INTRAVENOUS

## 2022-06-11 MED ORDER — LIDOCAINE 2% (20 MG/ML) 5 ML SYRINGE
INTRAMUSCULAR | Status: DC | PRN
  Administered 2022-06-11: 60 mg via INTRAVENOUS

## 2022-06-11 MED ORDER — CLONIDINE HCL (ANALGESIA) 100 MCG/ML EP SOLN
EPIDURAL | Status: AC
  Filled 2022-06-11: qty 10

## 2022-06-11 MED ORDER — EPHEDRINE 5 MG/ML INJ
INTRAVENOUS | Status: AC
  Filled 2022-06-11: qty 5

## 2022-06-11 MED ORDER — DEXAMETHASONE SODIUM PHOSPHATE 10 MG/ML IJ SOLN
INTRAMUSCULAR | Status: DC | PRN
  Administered 2022-06-11: 4 mg via INTRAVENOUS

## 2022-06-11 MED ORDER — CLONIDINE HCL (ANALGESIA) 100 MCG/ML EP SOLN
EPIDURAL | Status: DC | PRN
  Administered 2022-06-11: 1 mL via INTRA_ARTICULAR

## 2022-06-11 MED ORDER — FENTANYL CITRATE (PF) 100 MCG/2ML IJ SOLN
25.0000 ug | INTRAMUSCULAR | Status: DC | PRN
  Administered 2022-06-11: 50 ug via INTRAVENOUS

## 2022-06-11 MED ORDER — ACETAMINOPHEN 500 MG PO TABS
ORAL_TABLET | ORAL | Status: AC
  Filled 2022-06-11: qty 2

## 2022-06-11 MED ORDER — OXYCODONE HCL 5 MG PO TABS
5.0000 mg | ORAL_TABLET | Freq: Once | ORAL | Status: AC | PRN
  Administered 2022-06-11: 5 mg via ORAL

## 2022-06-11 MED ORDER — ASPIRIN 81 MG PO CHEW: 81.0000 mg | CHEWABLE_TABLET | Freq: Two times a day (BID) | ORAL | 0 refills | Status: DC

## 2022-06-11 MED ORDER — OXYCODONE HCL 5 MG PO TABS: 5.0000 mg | ORAL_TABLET | Freq: Four times a day (QID) | ORAL | 0 refills | Status: AC | PRN

## 2022-06-11 MED ORDER — CEFAZOLIN SODIUM-DEXTROSE 2-4 GM/100ML-% IV SOLN
2.0000 g | INTRAVENOUS | Status: AC
  Administered 2022-06-11: 2 g via INTRAVENOUS

## 2022-06-11 MED ORDER — BUPIVACAINE-EPINEPHRINE (PF) 0.5% -1:200000 IJ SOLN
INTRAMUSCULAR | Status: AC
  Filled 2022-06-11: qty 30

## 2022-06-11 MED ORDER — MEPERIDINE HCL 25 MG/ML IJ SOLN: 6.2500 mg | INTRAMUSCULAR | Status: DC | PRN

## 2022-06-11 MED ORDER — ACETAMINOPHEN ER 650 MG PO TBCR: 650.0000 mg | EXTENDED_RELEASE_TABLET | Freq: Three times a day (TID) | ORAL | 0 refills | Status: AC | PRN

## 2022-06-11 MED ORDER — PROPOFOL 500 MG/50ML IV EMUL
INTRAVENOUS | Status: DC | PRN
  Administered 2022-06-11: 25 ug/kg/min via INTRAVENOUS

## 2022-06-11 MED ORDER — OXYCODONE HCL 5 MG PO TABS
ORAL_TABLET | ORAL | Status: AC
  Filled 2022-06-11: qty 1

## 2022-06-11 MED ORDER — CEFAZOLIN SODIUM-DEXTROSE 2-4 GM/100ML-% IV SOLN
INTRAVENOUS | Status: AC
  Filled 2022-06-11: qty 100

## 2022-06-11 MED ORDER — MORPHINE SULFATE (PF) 4 MG/ML IV SOLN
INTRAVENOUS | Status: AC
  Filled 2022-06-11: qty 2

## 2022-06-11 SURGICAL SUPPLY — 51 items
BANDAGE ESMARK 6X9 LF (GAUZE/BANDAGES/DRESSINGS) IMPLANT
BLADE EXCALIBUR 4.0X13 (MISCELLANEOUS) IMPLANT
BLADE SURG 15 STRL LF DISP TIS (BLADE) IMPLANT
BLADE SURG 15 STRL SS (BLADE)
BNDG CMPR 9X6 STRL LF SNTH (GAUZE/BANDAGES/DRESSINGS)
BNDG ELASTIC 4X5.8 VLCR STR LF (GAUZE/BANDAGES/DRESSINGS) ×1 IMPLANT
BNDG ELASTIC 6X5.8 VLCR STR LF (GAUZE/BANDAGES/DRESSINGS) ×1 IMPLANT
BNDG ESMARK 6X9 LF (GAUZE/BANDAGES/DRESSINGS)
CUFF TOURN SGL QUICK 34 (TOURNIQUET CUFF)
CUFF TRNQT CYL 34X4.125X (TOURNIQUET CUFF) IMPLANT
DISSECTOR  3.8MM X 13CM (MISCELLANEOUS) ×1
DISSECTOR 3.8MM X 13CM (MISCELLANEOUS) ×1 IMPLANT
DRAPE ARTHROSCOPY W/POUCH 90 (DRAPES) ×1 IMPLANT
DRAPE INCISE IOBAN 66X45 STRL (DRAPES) IMPLANT
DRAPE U-SHAPE 47X51 STRL (DRAPES) ×2 IMPLANT
DRSG TEGADERM 4X4.75 (GAUZE/BANDAGES/DRESSINGS) ×2 IMPLANT
DURAPREP 26ML APPLICATOR (WOUND CARE) ×1 IMPLANT
DW OUTFLOW CASSETTE/TUBE SET (MISCELLANEOUS) ×1 IMPLANT
ELECT REM PT RETURN 9FT ADLT (ELECTROSURGICAL)
ELECTRODE REM PT RTRN 9FT ADLT (ELECTROSURGICAL) IMPLANT
EXCALIBUR 3.8MM X 13CM (MISCELLANEOUS) IMPLANT
GAUZE 4X4 16PLY ~~LOC~~+RFID DBL (SPONGE) IMPLANT
GAUZE SPONGE 4X4 12PLY STRL (GAUZE/BANDAGES/DRESSINGS) ×1 IMPLANT
GAUZE XEROFORM 1X8 LF (GAUZE/BANDAGES/DRESSINGS) ×1 IMPLANT
GLOVE BIO SURGEON STRL SZ7 (GLOVE) ×1 IMPLANT
GLOVE BIO SURGEON STRL SZ8 (GLOVE) ×1 IMPLANT
GLOVE BIOGEL PI IND STRL 7.0 (GLOVE) ×1 IMPLANT
GLOVE BIOGEL PI IND STRL 8 (GLOVE) ×1 IMPLANT
GOWN STRL REUS W/ TWL LRG LVL3 (GOWN DISPOSABLE) ×2 IMPLANT
GOWN STRL REUS W/ TWL XL LVL3 (GOWN DISPOSABLE) ×1 IMPLANT
GOWN STRL REUS W/TWL LRG LVL3 (GOWN DISPOSABLE) ×2
GOWN STRL REUS W/TWL XL LVL3 (GOWN DISPOSABLE) ×1
HOLDER KNEE FOAM BLUE (MISCELLANEOUS) IMPLANT
MANIFOLD NEPTUNE II (INSTRUMENTS) ×1 IMPLANT
NDL HYPO 18GX1.5 BLUNT FILL (NEEDLE) ×1 IMPLANT
NDL SAFETY ECLIP 18X1.5 (MISCELLANEOUS) ×2 IMPLANT
NEEDLE HYPO 18GX1.5 BLUNT FILL (NEEDLE) ×1 IMPLANT
PACK ARTHROSCOPY DSU (CUSTOM PROCEDURE TRAY) ×1 IMPLANT
PACK BASIN DAY SURGERY FS (CUSTOM PROCEDURE TRAY) ×1 IMPLANT
PADDING CAST COTTON 6X4 STRL (CAST SUPPLIES) ×1 IMPLANT
PENCIL SMOKE EVACUATOR (MISCELLANEOUS) IMPLANT
PORT APPOLLO RF 90DEGREE MULTI (SURGICAL WAND) IMPLANT
SUT ETHILON 3 0 PS 1 (SUTURE) ×1 IMPLANT
SUT VIC AB 2-0 SH 27 (SUTURE)
SUT VIC AB 2-0 SH 27XBRD (SUTURE) IMPLANT
SYR 5ML LL (SYRINGE) ×1 IMPLANT
SYR TB 1ML LL NO SAFETY (SYRINGE) ×1 IMPLANT
TOWEL GREEN STERILE FF (TOWEL DISPOSABLE) ×1 IMPLANT
TRAY DSU PREP LF (CUSTOM PROCEDURE TRAY) ×1 IMPLANT
TUBING ARTHROSCOPY IRRIG 16FT (MISCELLANEOUS) ×1 IMPLANT
WRAP KNEE MAXI GEL POST OP (GAUZE/BANDAGES/DRESSINGS) ×1 IMPLANT

## 2022-06-11 NOTE — Brief Op Note (Signed)
   06/11/2022  8:14 AM  PATIENT:  Phyllis Harvey  47 y.o. female  PRE-OPERATIVE DIAGNOSIS:  right knee chondromalacia  POST-OPERATIVE DIAGNOSIS:  right knee chondromalacia  PROCEDURE:  Procedure(s): RIGHT KNEE ARTHROSCOPY, DEBRIDEMENT AND CHRONDROPLAST OF PATELLA AND MEDIAL FEMORAL CONDYLE  SURGEON:  Surgeon(s): Meredith Pel, MD  ASSISTANT: magnant pa  ANESTHESIA:   general  EBL: 5 ml    Total I/O In: 700 [I.V.:700] Out: -   BLOOD ADMINISTERED: none  DRAINS: none   LOCAL MEDICATIONS USED:  marcaine mso4 clonidine  SPECIMEN:  No Specimen  COUNTS:  YES  TOURNIQUET:  * Missing tourniquet times found for documented tourniquets in log: [id number] *  DICTATION: .Other Dictation: Dictation Number [id number]  PLAN OF CARE: Discharge to home after PACU  PATIENT DISPOSITION:  PACU - hemodynamically stable

## 2022-06-11 NOTE — Anesthesia Postprocedure Evaluation (Signed)
Anesthesia Post Note  Patient: Phyllis Harvey  Procedure(s) Performed: RIGHT KNEE ARTHROSCOPY, DEBRIDEMENT AND CHRONDROPLAST OF PATELLA AND MEDIAL FEMORAL CONDYLE (Right: Knee)     Patient location during evaluation: PACU Anesthesia Type: General Level of consciousness: awake and alert Pain management: pain level controlled Vital Signs Assessment: post-procedure vital signs reviewed and stable Respiratory status: spontaneous breathing, nonlabored ventilation, respiratory function stable and patient connected to nasal cannula oxygen Cardiovascular status: blood pressure returned to baseline and stable Postop Assessment: no apparent nausea or vomiting Anesthetic complications: no  No notable events documented.  Last Vitals:  Vitals:   06/11/22 0854 06/11/22 0903  BP:  132/87  Pulse: 72 78  Resp: 19   Temp:  36.6 C  SpO2: 96% 96%    Last Pain:  Vitals:   06/11/22 0903  TempSrc: Oral  PainSc:                  Tannor Pyon

## 2022-06-11 NOTE — Anesthesia Procedure Notes (Signed)
Procedure Name: LMA Insertion Date/Time: 06/11/2022 7:38 AM  Performed by: Dellas Guard, Ernesta Amble, CRNAPre-anesthesia Checklist: Patient identified, Emergency Drugs available, Suction available and Patient being monitored Patient Re-evaluated:Patient Re-evaluated prior to induction Oxygen Delivery Method: Circle system utilized Preoxygenation: Pre-oxygenation with 100% oxygen Induction Type: IV induction Ventilation: Mask ventilation without difficulty LMA: LMA inserted LMA Size: 4.0 Number of attempts: 1 Airway Equipment and Method: Bite block Placement Confirmation: positive ETCO2 Tube secured with: Tape Dental Injury: Teeth and Oropharynx as per pre-operative assessment

## 2022-06-11 NOTE — Op Note (Signed)
[name], Phyllis Harvey MEDICAL RECORD NO: [id number] ACCOUNT NO: [id number] DATE OF BIRTH: 02/25/1976 FACILITY: [facility] LOCATION: [facility] PHYSICIAN: Yetta Barre. Marlou Sa, MD  Operative Report   DATE OF PROCEDURE: 06/11/2022  PREOPERATIVE DIAGNOSES:  Right knee chondromalacia and effusion.  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  PROCEDURES:  Right knee arthroscopy with chondroplasty and debridement of the undersurface of the patella and medial femoral condyle.  SURGEON:  Yetta Barre. Marlou Sa, MD  ASSISTANT:  Annie Main, PA.  INDICATIONS:  The patient is a 47 year old patient with right knee pain, who presents for operative management after explanation of risks and benefits.  She has had recurrent effusions.  DESCRIPTION OF PROCEDURE:  The patient was brought to the operating room where general anesthetic was induced.  Preoperative antibiotics administered.  Timeout was called.  Right knee was examined under anesthesia.  Found to have about 1.5 more  millimeters laxity with solid endpoint on the right knee compared to the left.  That was on Lachman and anterior drawer exam.  Posterior cruciate ligament intact and collaterals were stable to varus and valgus stress at 0, 30 degrees.  After examination  under anesthesia, right leg was prescrubbed with alcohol and Betadine, allowed to air dry, prepped with DuraPrep solution and draped in sterile manner.  Timeout was called.  Anterior anterior inferomedial portal was established under direct  visualization.  Diagnostic arthroscopy was performed.  There were some loose chondral fragments within the joint, which were removed with the shaver.  The ACL and PCL were intact.  There were some degenerative cystic changes at the attachment site of the  ACL onto the tibia.  There was also an anvil osteophyte, which was not impinging within the trochlear groove.  Because of the degenerative  cystic changes at the lateral aspect of the ACL insertion that osteophyte was not removed because it did have some  of the ACL attached to it.  Again, the osteophyte was not impinging in full extension. Lateral compartment, articular cartilage and meniscus was intact. Undersurface of the patella did have the chondral cleft, which was noted on MRI scan.  This was  debrided to stable surrounding chondral cartilage using a smooth shaver.  Medial meniscus was intact, the tibial plateau on the medial side did have some early grade 1 to 2 chondral changes on about 15-20% of the weightbearing surface area of the medial  tibial plateau.  However, on the medial femoral condyle there was about 50% area of grade II-III chondromalacia with loose chondral flaps present.  This was also debrided back to stable chondral flaps, but without any exposure of subchondral bone.  In  general, there was enough chondral cartilage left, which was attached, which made any type of microfracture not indicated.  [name]'s chondral flaps were debrided from that medial femoral condyle using a smooth shaver.  At this time, thorough irrigation of  the knee joint was performed.  Instruments were removed.  Portals were closed using 3-0 nylon.  A solution of Marcaine, morphine, clonidine injected into the knee.  Impervious dressings and Ace wrap applied.  The patient tolerated the procedure well  without immediate complications, transferred to the recovery room in stable condition.  [name]'s assistance was required at all times for retraction, opening, closing, mobilization of tissue.  His assistance was a medical necessity.     [email or web address] D: 06/11/2022 8:19:33 am T: 06/11/2022 8:31:00 am  JOB: [id number]/ [id number]

## 2022-06-11 NOTE — Transfer of Care (Signed)
Immediate Anesthesia Transfer of Care Note  Patient: TAYLYNN EASTON  Procedure(s) Performed: RIGHT KNEE ARTHROSCOPY, DEBRIDEMENT AND CHRONDROPLAST OF PATELLA AND MEDIAL FEMORAL CONDYLE (Right: Knee)  Patient Location: PACU  Anesthesia Type:General  Level of Consciousness: drowsy and patient cooperative  Airway & Oxygen Therapy: Patient Spontanous Breathing and Patient connected to face mask oxygen  Post-op Assessment: Report given to RN and Post -op Vital signs reviewed and stable  Post vital signs: Reviewed and stable  Last Vitals:  Vitals Value Taken Time  BP    Temp    Pulse 70 06/11/22 0822  Resp    SpO2 95 % 06/11/22 0822  Vitals shown include unvalidated device data.  Last Pain:  Vitals:   06/11/22 0631  TempSrc: Oral  PainSc: 0-No pain      Patients Stated Pain Goal: 3 (26/71/24 5809)  Complications: No notable events documented.

## 2022-06-11 NOTE — Discharge Instructions (Addendum)
  Post Anesthesia Home Care Instructions  Activity: Get plenty of rest for the remainder of the day. A responsible individual must stay with you for 24 hours following the procedure.  For the next 24 hours, DO NOT: -Drive a car -Paediatric nurse -Drink alcoholic beverages -Take any medication unless instructed by your physician -Make any legal decisions or sign important papers.  Meals: Start with liquid foods such as gelatin or soup. Progress to regular foods as tolerated. Avoid greasy, spicy, heavy foods. If nausea and/or vomiting occur, drink only clear liquids until the nausea and/or vomiting subsides. Call your physician if vomiting continues.  Special Instructions/Symptoms: Your throat may feel dry or sore from the anesthesia or the breathing tube placed in your throat during surgery. If this causes discomfort, gargle with warm salt water. The discomfort should disappear within 24 hours.  Next dose of Tylenol can be taken at 230 today if needed. Next dose of Ibuprofen can be taken at 230 today if needed.

## 2022-06-11 NOTE — H&P (Signed)
Phyllis Harvey is an 47 y.o. female.   Chief Complaint: right knee pain HPI: Phyllis Harvey is a 47 year old patient with right knee pain of long duration.  Her job requires her to go up and down a lot of stairs.  She has had multiple aspirations and injections with recurrent effusion.  MRI scan shows menisci to be intact.  She has failed nonoperative management.  Presents now for diagnostic arthroscopy to help elucidate the source of her recurrent effusions.  Denies any other orthopedic complaints.  Past Medical History:  Diagnosis Date   Depression    Hypertension     Past Surgical History:  Procedure Laterality Date   CESAREAN SECTION     cryotherapy of cervix     LIPOSUCTION  1995    Family History  Problem Relation Age of Onset   Hypertension Mother    Prostate cancer Father    Cancer Father        prostate   Hypertension Maternal Uncle    Cancer Maternal Grandmother    Heart attack Maternal Grandfather    Diabetes Other    Colon cancer Neg Hx    Colon polyps Neg Hx    Esophageal cancer Neg Hx    Stomach cancer Neg Hx    Rectal cancer Neg Hx    Social History:  reports that she has quit smoking. She has never used smokeless tobacco. She reports current alcohol use. She reports that she does not use drugs.  Allergies: No Known Allergies  Medications Prior to Admission  Medication Sig Dispense Refill   naproxen (NAPROSYN) 500 MG tablet TK 1 T PO Q 12 H WF PRN P  0   norethindrone-ethinyl estradiol (LOESTRIN) 1-20 MG-MCG tablet Take 1 tablet by mouth daily.     olmesartan (BENICAR) 20 MG tablet Take 20 mg by mouth daily.     ALPRAZolam (XANAX) 0.5 MG tablet Take 0.5 mg by mouth every 8 (eight) hours as needed.     cyclobenzaprine (FLEXERIL) 10 MG tablet Take 10 mg by mouth every 8 (eight) hours as needed.      Results for orders placed or performed during the hospital encounter of 06/11/22 (from the past 48 hour(s))  Pregnancy, urine POC     Status: None   Collection Time:  06/11/22  6:32 AM  Result Value Ref Range   Preg Test, Ur NEGATIVE NEGATIVE    Comment:        THE SENSITIVITY OF THIS METHODOLOGY IS >24 mIU/mL    No results found.  Review of Systems  Musculoskeletal:  Positive for arthralgias.  All other systems reviewed and are negative.   Blood pressure (!) 160/80, pulse 65, temperature 98.7 F (37.1 C), temperature source Oral, resp. rate 18, height '5\' 5"'$  (1.651 m), weight 78.9 kg, last menstrual period 06/09/2022, SpO2 100 %. Physical Exam Vitals reviewed.  HENT:     Head: Normocephalic.     Nose: Nose normal.     Mouth/Throat:     Mouth: Mucous membranes are moist.  Eyes:     Pupils: Pupils are equal, round, and reactive to light.  Cardiovascular:     Rate and Rhythm: Normal rate.     Pulses: Normal pulses.  Pulmonary:     Effort: Pulmonary effort is normal.  Abdominal:     General: Abdomen is flat.  Musculoskeletal:     Cervical back: Normal range of motion.  Skin:    General: Skin is warm.     Capillary  Refill: Capillary refill takes less than 2 seconds.  Neurological:     General: No focal deficit present.     Mental Status: She is alert.  Psychiatric:        Mood and Affect: Mood normal.   Ortho exam demonstrates full active and passive range of motion of the right knee.  No warmth to the right knee.  Mild effusion is present.  Patellofemoral crepitus is mild bilaterally.  No focal joint line tenderness is present.  No Baker's cyst palpable.  Extensor mechanism intact.  Collateral and cruciate ligaments are stable.  Assessment/Plan Impression is recurrent right knee effusion in an active patient.  I think it is possible she may have chondral surface irregularity or occult chondral defect which could be causing some of the symptoms.  Plan at this time is diagnostic arthroscopy.  This does not look like it is part of some other type of systemic polyarthropathy.  I think it is possible we may not find the source of her  recurrent effusions but inspection of the joint surfaces with arthroscopic evaluation indicated at this time due to failure of conservative management and persistent symptoms  Anderson Malta, MD 06/11/2022, 6:51 AM

## 2022-06-12 ENCOUNTER — Encounter (HOSPITAL_BASED_OUTPATIENT_CLINIC_OR_DEPARTMENT_OTHER): Payer: Self-pay | Admitting: Orthopedic Surgery

## 2022-06-18 ENCOUNTER — Ambulatory Visit (INDEPENDENT_AMBULATORY_CARE_PROVIDER_SITE_OTHER): Payer: 59 | Admitting: Surgical

## 2022-06-18 DIAGNOSIS — M25561 Pain in right knee: Secondary | ICD-10-CM

## 2022-06-24 ENCOUNTER — Encounter: Payer: Self-pay | Admitting: Surgical

## 2022-06-24 DIAGNOSIS — M2241 Chondromalacia patellae, right knee: Secondary | ICD-10-CM

## 2022-06-24 NOTE — Progress Notes (Signed)
   Post-Op Visit Note   Patient: Phyllis Harvey           Date of Birth: Dec 11, 1975           MRN: [id number] Visit Date: 06/18/2022 PCP: Ginger Organ., MD   Assessment & Plan:  Chief Complaint:  Chief Complaint  Patient presents with   Right Knee - Routine Post Op   Visit Diagnoses:  1. Right knee pain, unspecified chronicity     Plan: Patient is a 47 year old female who presents s/p right knee arthroscopy with debridement of loose chondral flaps.  Overall she is doing well following surgery.  She denies any fevers or chills.  No chest pain, shortness of breath, calf pain.  Taking aspirin every day for DVT prophylaxis.  She just notes discomfort and soreness and is not taking any opioid medication any longer.  On exam, she has 0 degrees extension and 115 degrees knee flexion.  She has incisions that are healing well with sutures intact.  The sutures were removed and replaced with Steri-Strips.  No calf tenderness.  Negative Homans' sign.  Quad firing with excellent quad strength today.  Plan is to start exercises consisting primarily of nonload bearing quadricep strengthening exercises as well as stationary bike.  See her back in 4 weeks and likely release at that time.  Follow-up with Dr. Maxie Better.  Follow-Up Instructions: No follow-ups on file.   Orders:  No orders of the defined types were placed in this encounter.  No orders of the defined types were placed in this encounter.   Imaging: No results found.  PMFS History: Patient Active Problem List   Diagnosis Date Noted   Inguinal pain, lower right quadrant 02/20/2011   Past Medical History:  Diagnosis Date   Depression    Hypertension     Family History  Problem Relation Age of Onset   Hypertension Mother    Prostate cancer Father    Cancer Father        prostate   Hypertension Maternal Uncle    Cancer Maternal Grandmother    Heart attack Maternal Grandfather    Diabetes Other    Colon cancer Neg Hx     Colon polyps Neg Hx    Esophageal cancer Neg Hx    Stomach cancer Neg Hx    Rectal cancer Neg Hx     Past Surgical History:  Procedure Laterality Date   CESAREAN SECTION     cryotherapy of cervix     KNEE ARTHROSCOPY Right 06/11/2022   Procedure: RIGHT KNEE ARTHROSCOPY, DEBRIDEMENT AND CHRONDROPLAST OF PATELLA AND MEDIAL FEMORAL CONDYLE;  Surgeon: Meredith Pel, MD;  Location: [facility];  Service: Orthopedics;  Laterality: Right;   LIPOSUCTION  1995   Social History   Occupational History   Not on file  Tobacco Use   Smoking status: Former   Smokeless tobacco: Never   Tobacco comments:    quit 2009  Vaping Use   Vaping Use: Never used  Substance and Sexual Activity   Alcohol use: Yes    Comment: occ   Drug use: No   Sexual activity: Not on file

## 2022-06-28 DIAGNOSIS — N920 Excessive and frequent menstruation with regular cycle: Secondary | ICD-10-CM | POA: Diagnosis not present

## 2022-07-08 ENCOUNTER — Other Ambulatory Visit: Payer: Self-pay | Admitting: Surgical

## 2022-07-09 DIAGNOSIS — E785 Hyperlipidemia, unspecified: Secondary | ICD-10-CM | POA: Diagnosis not present

## 2022-07-09 DIAGNOSIS — E669 Obesity, unspecified: Secondary | ICD-10-CM | POA: Diagnosis not present

## 2022-07-09 DIAGNOSIS — R7301 Impaired fasting glucose: Secondary | ICD-10-CM | POA: Diagnosis not present

## 2022-07-09 DIAGNOSIS — I10 Essential (primary) hypertension: Secondary | ICD-10-CM | POA: Diagnosis not present

## 2022-07-16 ENCOUNTER — Ambulatory Visit (INDEPENDENT_AMBULATORY_CARE_PROVIDER_SITE_OTHER): Payer: 59 | Admitting: Orthopedic Surgery

## 2022-07-16 DIAGNOSIS — M25461 Effusion, right knee: Secondary | ICD-10-CM

## 2022-07-17 ENCOUNTER — Encounter: Payer: Self-pay | Admitting: Orthopedic Surgery

## 2022-07-17 NOTE — Progress Notes (Signed)
   Post-Op Visit Note   Patient: Phyllis Harvey           Date of Birth: 1976-03-06           MRN: [id number] Visit Date: 07/16/2022 PCP: Ginger Organ., MD   Assessment & Plan:  Chief Complaint:  Chief Complaint  Patient presents with   Right Knee - Follow-up    06/11/22 (5w)Right Knee Arthroscopy, Debridement And Chondroplasty Of Patella And Medial Femoral Condyle     Visit Diagnoses:  1. Effusion, right knee     Plan: Phyllis Harvey is now about 5 weeks out right knee arthroscopy with debridement and chondroplasty of patella and medial femoral condyle.  Arthroscopic photos reviewed with the patient.  Does show some grade II chondromalacia on both the medial and lateral condyles as well as within the patellofemoral joint.  Overall she is doing well.  Taking naproxen as needed.  She has a little bit of pain going up and down stairs.  She has been doing some leg press and leg extension.  On exam no effusion and improving quad strength.  No calf tenderness negative Homans.  Plan at this time is to continue nonweightbearing quad strengthening exercises.  Stationary bike encouraged.  If she has recurrent pain or swelling then we could consider repeat injection but for now we will see how she does.  Follow-up as needed.  Follow-Up Instructions: No follow-ups on file.   Orders:  No orders of the defined types were placed in this encounter.  No orders of the defined types were placed in this encounter.   Imaging: No results found.  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   Inguinal pain, lower right quadrant 02/20/2011   Past Medical History:  Diagnosis Date   Depression    Hypertension     Family History  Problem Relation Age of Onset   Hypertension Mother    Prostate cancer Father    Cancer Father        prostate   Hypertension Maternal Uncle    Cancer Maternal Grandmother    Heart attack  Maternal Grandfather    Diabetes Other    Colon cancer Neg Hx    Colon polyps Neg Hx    Esophageal cancer Neg Hx    Stomach cancer Neg Hx    Rectal cancer Neg Hx     Past Surgical History:  Procedure Laterality Date   CESAREAN SECTION     cryotherapy of cervix     KNEE ARTHROSCOPY Right 06/11/2022   Procedure: RIGHT KNEE ARTHROSCOPY, DEBRIDEMENT AND CHRONDROPLAST OF PATELLA AND MEDIAL FEMORAL CONDYLE;  Surgeon: Meredith Pel, MD;  Location: [facility];  Service: Orthopedics;  Laterality: Right;   LIPOSUCTION  1995   Social History   Occupational History   Not on file  Tobacco Use   Smoking status: Former   Smokeless tobacco: Never   Tobacco comments:    quit 2009  Vaping Use   Vaping Use: Never used  Substance and Sexual Activity   Alcohol use: Yes    Comment: occ   Drug use: No   Sexual activity: Not on file

## 2023-04-03 DIAGNOSIS — I10 Essential (primary) hypertension: Secondary | ICD-10-CM | POA: Diagnosis not present

## 2023-04-03 DIAGNOSIS — E785 Hyperlipidemia, unspecified: Secondary | ICD-10-CM | POA: Diagnosis not present

## 2023-04-03 DIAGNOSIS — R7301 Impaired fasting glucose: Secondary | ICD-10-CM | POA: Diagnosis not present

## 2023-04-08 DIAGNOSIS — Z01419 Encounter for gynecological examination (general) (routine) without abnormal findings: Secondary | ICD-10-CM | POA: Diagnosis not present

## 2023-04-08 DIAGNOSIS — Z1331 Encounter for screening for depression: Secondary | ICD-10-CM | POA: Diagnosis not present

## 2023-04-08 DIAGNOSIS — Z1231 Encounter for screening mammogram for malignant neoplasm of breast: Secondary | ICD-10-CM | POA: Diagnosis not present

## 2023-04-10 DIAGNOSIS — I10 Essential (primary) hypertension: Secondary | ICD-10-CM | POA: Diagnosis not present

## 2023-06-13 IMAGING — MR MR KNEE*R* W/O CM
4 of 7 series · 18 of 40 positions shown · non-contrast
Comparison: Right knee radiographs 07/07/2021

CLINICAL DATA: Posterolateral knee pain and swelling for several
months. Status post fluid drain from knee. Clicking and popping.
Locking. Evaluate for lateral meniscal tear.

EXAM:
MRI OF THE RIGHT KNEE WITHOUT CONTRAST
TECHNIQUE: Multiplanar, multisequence MR imaging of the knee was performed. No
intravenous contrast was administered.

[Series 2: T2 fat-sat · axial · 4.0mm · 0.31mm/px · z∈[-80,+35]mm · 3 of 24 slices shown]
[im 1/24]
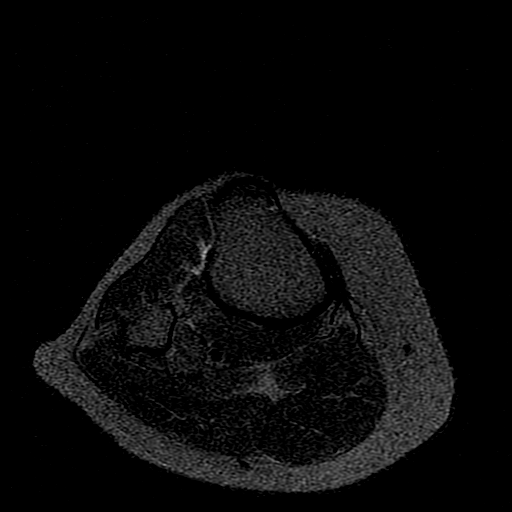
[im 12/24]
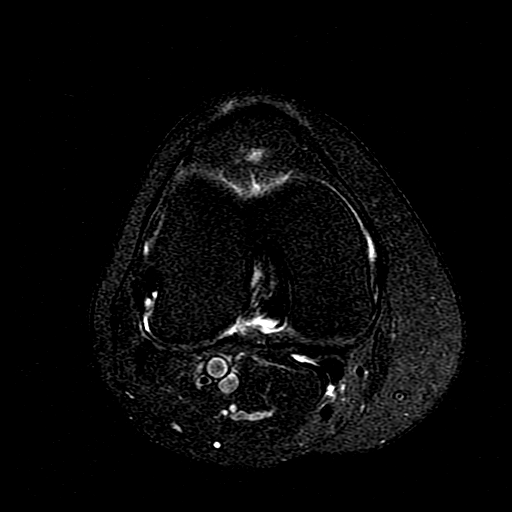
[im 24/24]
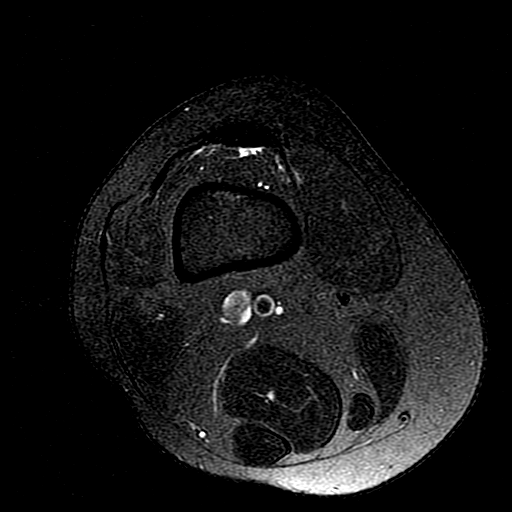

[Series 5: PD fat-sat · coronal · 4.0mm · 0.29mm/px · 6 of 21 slices shown (1 of 2)]
[im 1/21]
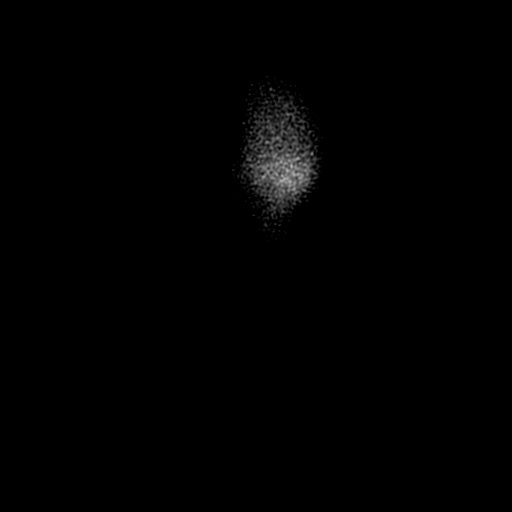
[im 5/21]
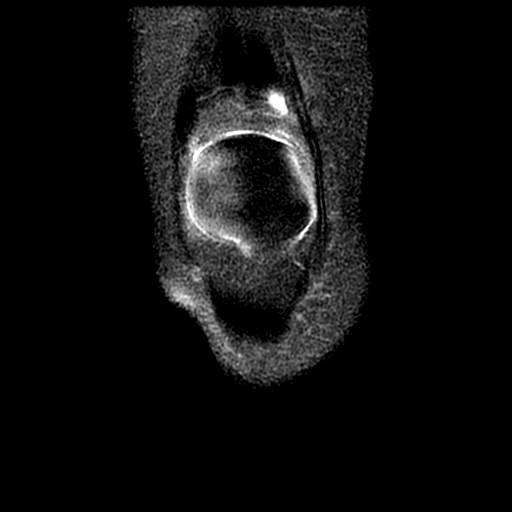
[im 9/21]
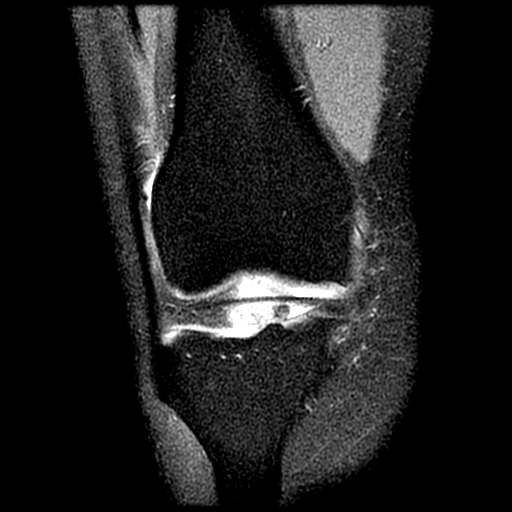
[im 13/21]
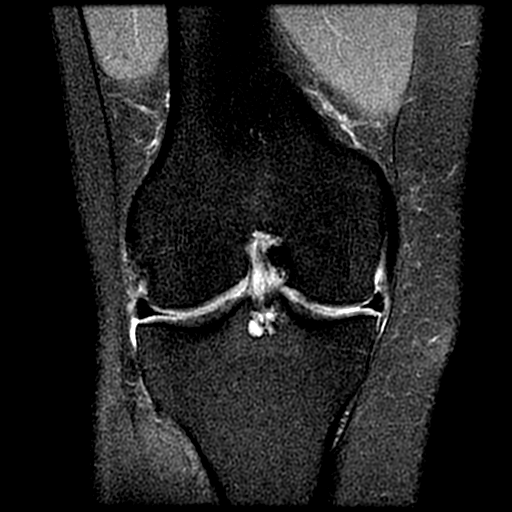
[im 17/21]
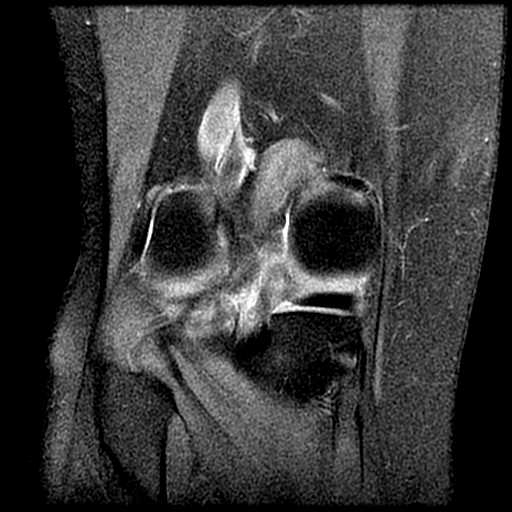
[im 21/21]
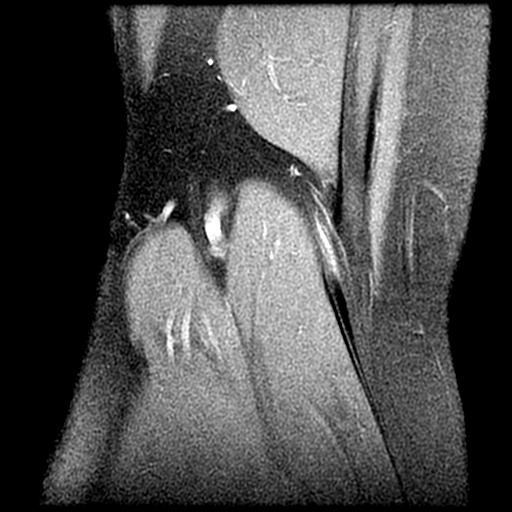

[Series 6: PD fat-sat · sagittal · 3.0mm · 0.29mm/px · 6 of 25 slices shown (2 of 2)]
[im 1/25]
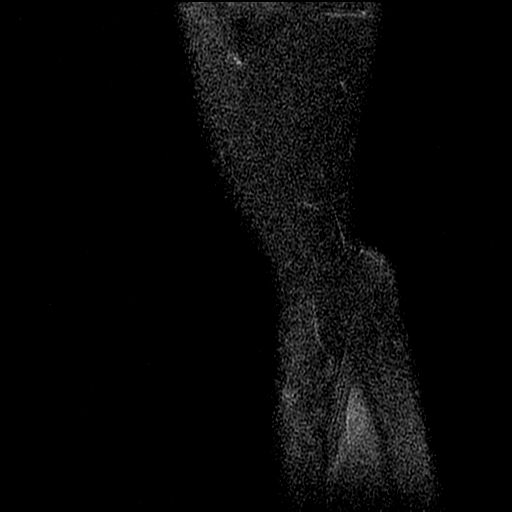
[im 5/25]
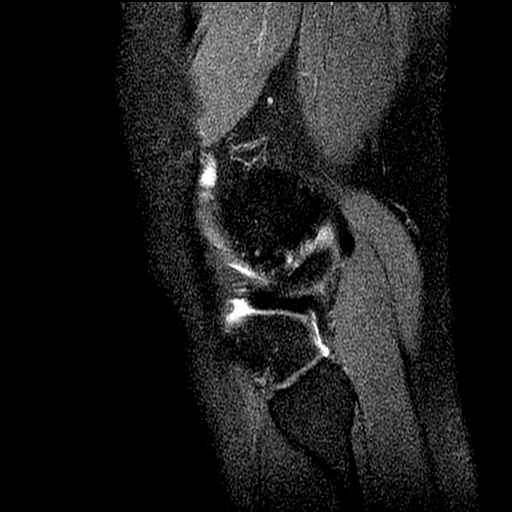
[im 9/25]
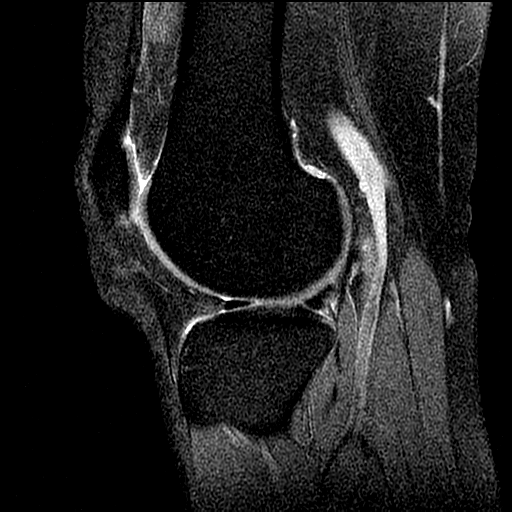
[im 13/25]
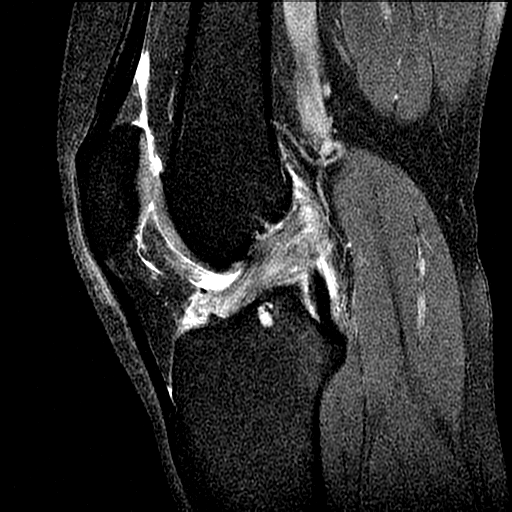
[im 17/25]
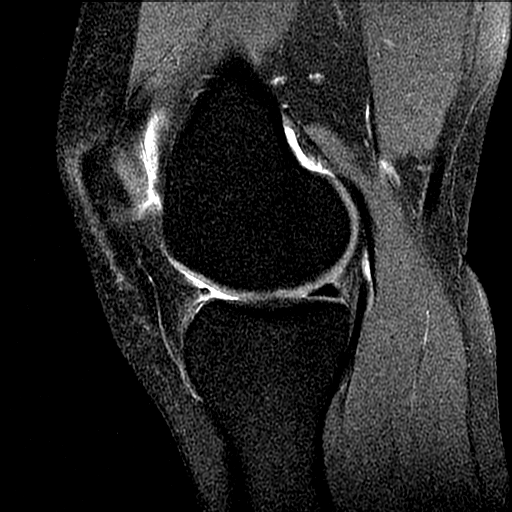
[im 21/25]
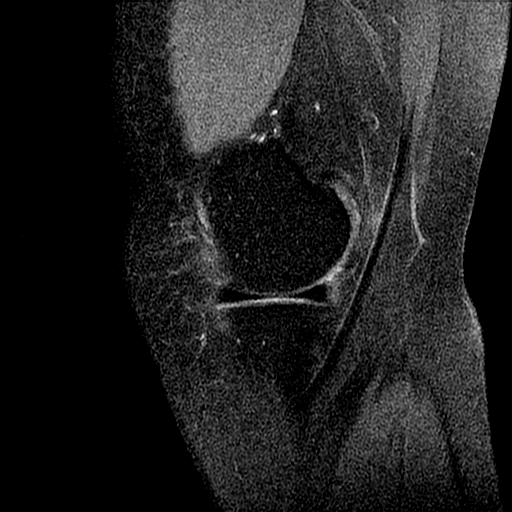

[Series 8: PD · oblique · 2.0mm · 0.29mm/px · 3 of 15 slices shown]
[im 1/15]
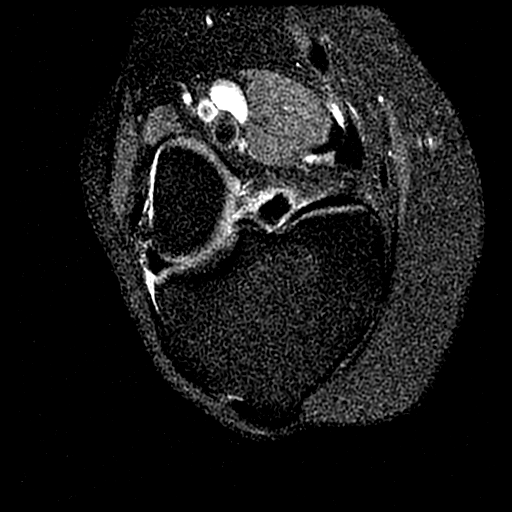
[im 10/15]
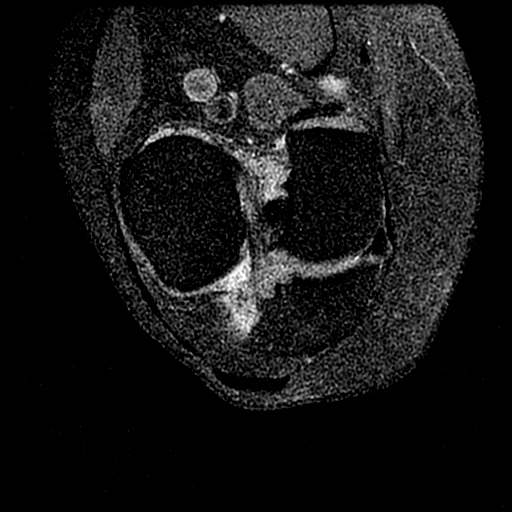
[im 15/15]
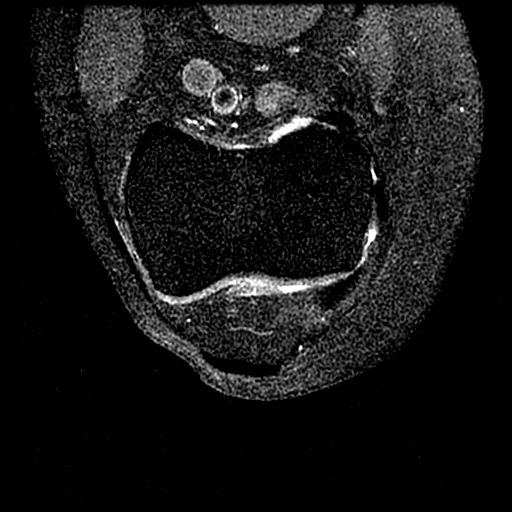

[18 of 40 positions shown; findings below may reference images not displayed]

FINDINGS: MENISCI

Medial meniscus:  Intact.

Lateral meniscus:  Intact.

LIGAMENTS

Cruciates: There is moderate intermediate T2 signal and fusiform
thickening of the mid to superior ACL fibers. There may be a small
partial-thickness tear at the superior ACL origin (axial image 11
and sagittal image 14). There also appears to be extension of some
of the distal anterior ACL fibers anteriorly into the intercondylar
notch suggesting an additional partial-thickness tear. No acute
pivot-shift marrow contusion pattern is seen common these tears are
likely chronic. The PCL is intact.

Collaterals: The medial collateral ligament is intact. The fibular
collateral ligament, biceps femoris tendon, iliotibial band, and
popliteus tendon are intact.

CARTILAGE

Patellofemoral: There is a high-grade partial-thickness fissure
within the junction of the patellar apex and lateral patellar facet
at mid height.

Medial: Moderate thinning of the weight-bearing medial femoral
condyle cartilage.

Lateral: Mild thinning of the lateral aspect of the weight-bearing
lateral femoral condyle cartilage.

Joint: Tinyjoint effusion. Normal Hoffa's fat pad. No plical
thickening.

Popliteal Fossa:  Tiny Baker's cyst.

Extensor Mechanism:  Intact quadriceps tendon and patellar tendon.

Bones:  No acute fracture or dislocation.

Other: Mild-to-moderate chronic cystic change within the tibial
spines.
IMPRESSION: :
IMPRESSION: 1. Partial-thickness tears of the distal anterior ACL and likely the
superior aspect of the ACL with fusiform thickening of the mid to
superior ACL fibers. No acute pivot-shift marrow contusion pattern
is seen and this may be chronic.
2. Mild tricompartmental cartilage degenerative changes greatest
within the weight-bearing medial femoral condyle.
3. Tiny joint effusion and tiny Baker's cyst.
